# Patient Record
Sex: Male | Born: 1935 | Hispanic: No | Smoking: Former smoker
Health system: Southern US, Community
[De-identification: ages and names within clinical notes are randomized; demographics above are authoritative.]

## PROBLEM LIST (undated history)

## (undated) DIAGNOSIS — H6123 Impacted cerumen, bilateral: Secondary | ICD-10-CM

## (undated) DIAGNOSIS — I10 Essential (primary) hypertension: Secondary | ICD-10-CM

## (undated) DIAGNOSIS — E7800 Pure hypercholesterolemia, unspecified: Secondary | ICD-10-CM

## (undated) DIAGNOSIS — N183 Chronic kidney disease, stage 3 (moderate): Secondary | ICD-10-CM

## (undated) DIAGNOSIS — Z131 Encounter for screening for diabetes mellitus: Secondary | ICD-10-CM

## (undated) DIAGNOSIS — I83891 Varicose veins of right lower extremities with other complications: Secondary | ICD-10-CM

## (undated) DIAGNOSIS — I6521 Occlusion and stenosis of right carotid artery: Secondary | ICD-10-CM

## (undated) DIAGNOSIS — E785 Hyperlipidemia, unspecified: Secondary | ICD-10-CM

## (undated) DIAGNOSIS — I639 Cerebral infarction, unspecified: Secondary | ICD-10-CM

## (undated) HISTORY — DX: Chronic kidney disease, stage 3 (moderate): N18.3

## (undated) HISTORY — DX: Pure hypercholesterolemia, unspecified: E78.00

## (undated) HISTORY — DX: Occlusion and stenosis of right carotid artery: I65.21

## (undated) HISTORY — DX: Essential (primary) hypertension: I10

## (undated) HISTORY — DX: Impacted cerumen, bilateral: H61.23

## (undated) HISTORY — DX: Varicose veins of right lower extremity with other complications: I83.891

## (undated) HISTORY — DX: Cerebral infarction, unspecified: I63.9

## (undated) HISTORY — DX: Encounter for screening for diabetes mellitus: Z13.1

## (undated) HISTORY — PX: HERNIA REPAIR: SHX51

## (undated) HISTORY — DX: Hyperlipidemia, unspecified: E78.5

---

## 2016-03-06 DIAGNOSIS — H353132 Nonexudative age-related macular degeneration, bilateral, intermediate dry stage: Secondary | ICD-10-CM | POA: Diagnosis not present

## 2017-03-01 DIAGNOSIS — H43813 Vitreous degeneration, bilateral: Secondary | ICD-10-CM | POA: Diagnosis not present

## 2017-03-01 DIAGNOSIS — H353132 Nonexudative age-related macular degeneration, bilateral, intermediate dry stage: Secondary | ICD-10-CM | POA: Diagnosis not present

## 2017-09-26 DIAGNOSIS — H2513 Age-related nuclear cataract, bilateral: Secondary | ICD-10-CM | POA: Diagnosis not present

## 2017-10-25 DIAGNOSIS — R0981 Nasal congestion: Secondary | ICD-10-CM | POA: Diagnosis not present

## 2017-10-25 DIAGNOSIS — M7989 Other specified soft tissue disorders: Secondary | ICD-10-CM | POA: Diagnosis not present

## 2017-10-25 DIAGNOSIS — J181 Lobar pneumonia, unspecified organism: Secondary | ICD-10-CM | POA: Diagnosis not present

## 2017-10-25 DIAGNOSIS — I10 Essential (primary) hypertension: Secondary | ICD-10-CM | POA: Diagnosis not present

## 2017-10-25 DIAGNOSIS — E785 Hyperlipidemia, unspecified: Secondary | ICD-10-CM | POA: Diagnosis not present

## 2017-10-25 DIAGNOSIS — Z79899 Other long term (current) drug therapy: Secondary | ICD-10-CM | POA: Diagnosis not present

## 2017-10-25 DIAGNOSIS — J3489 Other specified disorders of nose and nasal sinuses: Secondary | ICD-10-CM | POA: Diagnosis not present

## 2017-11-02 DIAGNOSIS — L819 Disorder of pigmentation, unspecified: Secondary | ICD-10-CM | POA: Diagnosis not present

## 2017-11-02 DIAGNOSIS — E785 Hyperlipidemia, unspecified: Secondary | ICD-10-CM | POA: Diagnosis not present

## 2017-11-02 DIAGNOSIS — Z79899 Other long term (current) drug therapy: Secondary | ICD-10-CM | POA: Diagnosis not present

## 2017-11-02 DIAGNOSIS — R079 Chest pain, unspecified: Secondary | ICD-10-CM | POA: Diagnosis not present

## 2017-11-02 DIAGNOSIS — I10 Essential (primary) hypertension: Secondary | ICD-10-CM | POA: Diagnosis not present

## 2017-11-02 DIAGNOSIS — R9431 Abnormal electrocardiogram [ECG] [EKG]: Secondary | ICD-10-CM | POA: Diagnosis not present

## 2017-11-02 DIAGNOSIS — R239 Unspecified skin changes: Secondary | ICD-10-CM | POA: Diagnosis not present

## 2017-11-05 ENCOUNTER — Encounter: Payer: Self-pay | Admitting: Physician Assistant

## 2017-11-05 ENCOUNTER — Ambulatory Visit (INDEPENDENT_AMBULATORY_CARE_PROVIDER_SITE_OTHER): Payer: Medicare Other | Admitting: Physician Assistant

## 2017-11-05 ENCOUNTER — Encounter (INDEPENDENT_AMBULATORY_CARE_PROVIDER_SITE_OTHER): Payer: Self-pay

## 2017-11-05 VITALS — BP 140/82 | HR 82 | Ht 66.0 in | Wt 140.0 lb

## 2017-11-05 DIAGNOSIS — I872 Venous insufficiency (chronic) (peripheral): Secondary | ICD-10-CM | POA: Diagnosis not present

## 2017-11-05 DIAGNOSIS — R609 Edema, unspecified: Secondary | ICD-10-CM | POA: Diagnosis not present

## 2017-11-05 DIAGNOSIS — E7800 Pure hypercholesterolemia, unspecified: Secondary | ICD-10-CM

## 2017-11-05 DIAGNOSIS — I83891 Varicose veins of right lower extremities with other complications: Secondary | ICD-10-CM | POA: Insufficient documentation

## 2017-11-05 DIAGNOSIS — I6521 Occlusion and stenosis of right carotid artery: Secondary | ICD-10-CM

## 2017-11-05 DIAGNOSIS — R079 Chest pain, unspecified: Secondary | ICD-10-CM | POA: Diagnosis not present

## 2017-11-05 DIAGNOSIS — N183 Chronic kidney disease, stage 3 unspecified: Secondary | ICD-10-CM

## 2017-11-05 DIAGNOSIS — K2100 Gastro-esophageal reflux disease with esophagitis, without bleeding: Secondary | ICD-10-CM

## 2017-11-05 DIAGNOSIS — I10 Essential (primary) hypertension: Secondary | ICD-10-CM

## 2017-11-05 DIAGNOSIS — Z131 Encounter for screening for diabetes mellitus: Secondary | ICD-10-CM

## 2017-11-05 DIAGNOSIS — H6123 Impacted cerumen, bilateral: Secondary | ICD-10-CM

## 2017-11-05 DIAGNOSIS — R600 Localized edema: Secondary | ICD-10-CM

## 2017-11-05 HISTORY — DX: Pure hypercholesterolemia, unspecified: E78.00

## 2017-11-05 HISTORY — DX: Encounter for screening for diabetes mellitus: Z13.1

## 2017-11-05 HISTORY — DX: Essential (primary) hypertension: I10

## 2017-11-05 HISTORY — DX: Varicose veins of right lower extremity with other complications: I83.891

## 2017-11-05 HISTORY — DX: Chronic kidney disease, stage 3 unspecified: N18.30

## 2017-11-05 HISTORY — DX: Occlusion and stenosis of right carotid artery: I65.21

## 2017-11-05 HISTORY — DX: Impacted cerumen, bilateral: H61.23

## 2017-11-05 MED ORDER — RANITIDINE HCL 150 MG PO TABS: 150.0000 mg | ORAL_TABLET | Freq: Two times a day (BID) | ORAL | 5 refills | Status: DC

## 2017-11-05 NOTE — Progress Notes (Addendum)
Subjective:    Patient ID: Mark Mora, male    DOB: 1919/12/23, 83 y.o.   MRN: [id number]  HPI Mark Mora is a 83 y/o male who recently relocated to [place] from [place]. He is here to establish care.   He was seen in the ER on 10/25/17 and treated for pneumonia with levofloxacin. He returned to the ER on 11/02/17 for left 4th finger pain/discoloration and chest pain/discomfort and received a cardiac workup. He states that he does not have any chest pain today but described it as intermittent "discomfort" across his lower chest. He tried drinking baking soda in water and experienced relief of his symptoms so he believes it is acid reflux. He was referred to Dr. Alroy Dust with [facility] for further cardiac workup with stress test and cardiac ultrasound, however has not scheduled an appointment yet because he would prefer to see a cardiologist in the [place]. His left 4th finger is still slightly discolored and purple today but does not hurt. Cardiac work up with EKG, labs, CXR were normal. They referred him to a cardiologist for further work up.  Patient denies chest pain, and shortness of breath. He reports swelling in his right lower extremity and foot that is typically relieved with elevation. He has not been able to get up and walk around much due to back pain from spinal stenosis. He also complains of decreased hearing over the last couple months due to wax build up in his ears and requests to have them cleaned today.  Health maintenance - he received the flu vaccine in October, pneumonia vaccine and tetanus vaccine unknown so we will check his records that are being sent over, he has not had the shingles vaccine.  .. Active Ambulatory Problems    Diagnosis Date Noted  . CKD (chronic kidney disease) stage 3, GFR 30-59 ml/min (HCC) 11/05/2017  . 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 Essential hypertension 11/05/2017  . Carotid atherosclerosis, right  11/05/2017  . Pure hypercholesterolemia 11/05/2017  . Screening for diabetes mellitus 11/05/2017  . Hearing loss due to cerumen impaction, bilateral 11/05/2017   Resolved Ambulatory Problems    Diagnosis Date Noted  . No Resolved Ambulatory Problems   Past Medical History:  Diagnosis Date  . Hyperlipidemia   . Hypertension      Review of Systems  HENT: Positive for hearing loss (patient states he has wax buildup).   Respiratory: Negative for shortness of breath.   Cardiovascular: Positive for leg swelling (right). Negative for chest pain.  Musculoskeletal: Positive for back pain.      Objective:   Physical Exam  Constitutional: He is oriented to person, place, and time. He appears well-developed and well-nourished.  HENT:  Head: Normocephalic and atraumatic.  Unable to visualize TM due to cerumen buildup  Neck: Normal range of motion. Neck supple.  Cardiovascular: Normal rate and regular rhythm.  Pulmonary/Chest: Effort normal and breath sounds normal. He has no wheezes.  Musculoskeletal: He exhibits edema (Right lower extremity).  Neurological: He is alert and oriented to person, place, and time.  Psychiatric: He has a normal mood and affect. His behavior is normal. Thought content normal.      Assessment & Plan:  Mark Mora was seen today for new patient (initial visit).  Diagnoses and all orders for this visit:  Pure hypercholesterolemia -     Lipid Panel w/reflex Direct LDL  Screening for diabetes mellitus -     COMPLETE METABOLIC PANEL WITH  GFR  Carotid atherosclerosis, right -     Lipid Panel w/reflex Direct LDL  Essential hypertension  Edema of both lower legs due to peripheral venous insufficiency  CKD (chronic kidney disease) stage 3, GFR 30-59 ml/min (HCC)  Hearing loss due to cerumen impaction, bilateral  Gastroesophageal reflux disease with esophagitis -     ranitidine (ZANTAC) 150 MG tablet; Take 1 tablet (150 mg total) by mouth 2 (two) times  daily.  Chest pain, unspecified type -     Ambulatory referral to Cardiology  .Marland Kitchen Depression screen [facility] 2/9 11/05/2017 11/05/2017  Decreased Interest 0 0  Down, Depressed, Hopeless 0 0  PHQ - 2 Score 0 0  Altered sleeping 0 -  Tired, decreased energy 1 -  Change in appetite 0 -  Feeling bad or failure about yourself  0 -  Trouble concentrating 0 -  Moving slowly or fidgety/restless 0 -  Suicidal thoughts 0 -  PHQ-9 Score 1 -  Difficult doing work/chores Not difficult at all -   Chest pain -unclear etiology suspect due to GERD. See GERD treatment plan -will make new referral to [facility] provider at patients request. For full work up.   Pure hypercholesterolemia and Carotid atherosclerosis, right - Patient stated that it has been a long time since he had his cholesterol checked, however was started on a statin after having right carotid artery surgery in 2006 for partial blockage. Will get Lipid Panel w/reflex Direct LDL. Advised patient to followup with cardiologist for full cardiac workup and we will send a referral to [facility] for cardiologist. Patient was advised that he should be called with referral and to notify us if he has not heard from a cardiologist within one week. His last Carotid US was about 1 year ago so he is due for repeat carotid US and we will request this be done at the same time as his cardiac workup and stress test. -pt is on statin.  Essential hypertension - Continue taking antihypertensives as prescribed. Patient was advised that when he starts to run low on his medications that we can send a refill prescription.  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 Noted obstructive, copious cerumen that cannot be removed without magnification and instrumentations requiring physician skills Consent: Discussed benefits and risks of procedure and verbal consent obtained Procedure: Patient was prepped for the procedure. Utilized an otoscope to assess and take note of the ear canal, the tympanic membrane, and the presence, amount, and placement of the cerumen. Gentle water irrigation and soft plastic curette was utilized to remove cerumen.  Post procedure examination: shows cerumen was completely removed. Patient tolerated procedure well. The patient is made aware that they may experience temporary vertigo, temporary hearing loss, and temporary discomfort. If these symptom last for more than 24 hours to call the clinic or proceed to the ED.    GERD - Prescribed Zantac 150mg  to be taken twice daily as needed. Advised patient to take  one pill about 30 minutes before his largest meal for best results, however he can take up to 2 pills daily if needed.  - Advised patient to follow-up every 6 months, or sooner if needed.

## 2017-11-05 NOTE — Progress Notes (Deleted)
   Subjective:    Patient ID: Mark Mora, male    DOB: [birth year], 83 y.o.   MRN: [id number]  HPI Dr. Alroy Dust not gone yet. Lives in [place]. Will make different.   Flu shot October   Review of Systems  Constitutional: Positive for unexpected weight change.  Respiratory:       Chest pain   Genitourinary: Positive for frequency.       Objective:   Physical Exam        Assessment & Plan:

## 2017-11-06 LAB — COMPLETE METABOLIC PANEL WITH GFR
AG Ratio: 1.5 (calc) (ref 1.0–2.5)
ALBUMIN MSPROF: 3.8 g/dL (ref 3.6–5.1)
ALKALINE PHOSPHATASE (APISO): 42 U/L (ref 40–115)
ALT: 10 U/L (ref 9–46)
AST: 19 U/L (ref 10–35)
BILIRUBIN TOTAL: 0.4 mg/dL (ref 0.2–1.2)
BUN/Creatinine Ratio: 19 (calc) (ref 6–22)
BUN: 27 mg/dL — AB (ref 7–25)
CHLORIDE: 107 mmol/L (ref 98–110)
CO2: 21 mmol/L (ref 20–32)
Calcium: 9.1 mg/dL (ref 8.6–10.3)
Creat: 1.42 mg/dL — ABNORMAL HIGH (ref 0.70–1.11)
GFR, Est African American: 48 mL/min/{1.73_m2} — ABNORMAL LOW (ref >=60)
GFR, Est Non African American: 42 mL/min/{1.73_m2} — ABNORMAL LOW (ref >=60)
GLUCOSE: 103 mg/dL — AB (ref 65–99)
Globulin: 2.5 g/dL (calc) (ref 1.9–3.7)
POTASSIUM: 5 mmol/L (ref 3.5–5.3)
Sodium: 139 mmol/L (ref 135–146)
Total Protein: 6.3 g/dL (ref 6.1–8.1)

## 2017-11-06 LAB — LIPID PANEL W/REFLEX DIRECT LDL
CHOL/HDL RATIO: 1.5 (calc) (ref <5.0)
Cholesterol: 173 mg/dL (ref <200)
HDL: 119 mg/dL (ref >40)
LDL CHOLESTEROL (CALC): 41 mg/dL
Non-HDL Cholesterol (Calc): 54 mg/dL (calc) (ref <130)
TRIGLYCERIDES: 45 mg/dL (ref <150)

## 2017-11-14 ENCOUNTER — Other Ambulatory Visit: Payer: Self-pay | Admitting: *Deleted

## 2017-11-14 DIAGNOSIS — I10 Essential (primary) hypertension: Secondary | ICD-10-CM

## 2017-11-14 DIAGNOSIS — K2100 Gastro-esophageal reflux disease with esophagitis, without bleeding: Secondary | ICD-10-CM

## 2017-11-14 DIAGNOSIS — E7800 Pure hypercholesterolemia, unspecified: Secondary | ICD-10-CM

## 2017-11-14 DIAGNOSIS — I6521 Occlusion and stenosis of right carotid artery: Secondary | ICD-10-CM

## 2017-11-14 MED ORDER — LISINOPRIL 20 MG PO TABS: 20.0000 mg | ORAL_TABLET | Freq: Every day | ORAL | 1 refills | Status: DC

## 2017-11-14 MED ORDER — ATORVASTATIN CALCIUM 40 MG PO TABS: 40.0000 mg | ORAL_TABLET | Freq: Every day | ORAL | 1 refills | Status: DC

## 2017-11-14 MED ORDER — AMLODIPINE BESYLATE 5 MG PO TABS: 5.0000 mg | ORAL_TABLET | Freq: Every day | ORAL | 1 refills | Status: DC

## 2017-11-14 MED ORDER — RANITIDINE HCL 150 MG PO TABS: 150.0000 mg | ORAL_TABLET | Freq: Two times a day (BID) | ORAL | 3 refills | Status: DC

## 2017-11-15 ENCOUNTER — Telehealth: Payer: Self-pay | Admitting: Physician Assistant

## 2017-11-15 ENCOUNTER — Other Ambulatory Visit: Payer: Self-pay | Admitting: *Deleted

## 2017-11-15 NOTE — Telephone Encounter (Signed)
Patient's daughter dropped off a CD of medical records for patient. Daughter asked for a receipt. We typically do not give receipts for medical records, so I informed her we would enter a note in patient's chart documenting the receipt of his medical records.

## 2017-12-31 ENCOUNTER — Other Ambulatory Visit: Payer: Self-pay

## 2017-12-31 DIAGNOSIS — I10 Essential (primary) hypertension: Secondary | ICD-10-CM

## 2017-12-31 MED ORDER — LISINOPRIL 20 MG PO TABS: 20.0000 mg | ORAL_TABLET | Freq: Every day | ORAL | 1 refills | Status: DC

## 2017-12-31 MED ORDER — AMLODIPINE BESYLATE 5 MG PO TABS: 5.0000 mg | ORAL_TABLET | Freq: Every day | ORAL | 0 refills | Status: DC

## 2017-12-31 MED ORDER — AMLODIPINE BESYLATE 5 MG PO TABS: 5.0000 mg | ORAL_TABLET | Freq: Every day | ORAL | 1 refills | Status: DC

## 2017-12-31 MED ORDER — LISINOPRIL 20 MG PO TABS: 20.0000 mg | ORAL_TABLET | Freq: Every day | ORAL | 0 refills | Status: DC

## 2017-12-31 NOTE — Telephone Encounter (Signed)
Patient called in requesting medication refill on the following be sent to [facility]: Lisinopril and Amlodipine.  Advised patient a med refill for both medication will be electronically sent to [facility] to make sure he doesn't run out while new Rx is sent to [facility].  Patient stated he understood and had no other questions/concerns.

## 2018-01-16 ENCOUNTER — Other Ambulatory Visit: Payer: Self-pay | Admitting: *Deleted

## 2018-01-20 NOTE — Progress Notes (Deleted)
    Referring-Jade L Breeback PA-C Reason for referral-chest pain  HPI: 84 year old male for evaluation of chest pain at request of Donella Stade PA-C. Seen in ER 11/18 for CP; chest xray neg; troponin normal.   Current Outpatient Medications  Medication Sig Dispense Refill  . amLODipine (NORVASC) 5 MG tablet Take 1 tablet (5 mg total) by mouth daily. 90 tablet 1  . atorvastatin (LIPITOR) 40 MG tablet Take 1 tablet (40 mg total) by mouth daily. 90 tablet 1  . furosemide (LASIX) 20 MG tablet Take 20 mg by mouth.    Marland Kitchen lisinopril (PRINIVIL,ZESTRIL) 20 MG tablet Take 1 tablet (20 mg total) by mouth daily. 90 tablet 1  . ranitidine (ZANTAC) 150 MG tablet Take 1 tablet (150 mg total) by mouth 2 (two) times daily. 180 tablet 3   No current facility-administered medications for this visit.     Not on File  Past Medical History:  Diagnosis Date  . Hyperlipidemia   . Hypertension     Past Surgical History:  Procedure Laterality Date  . HERNIA REPAIR      Social History   Socioeconomic History  . Marital status: Widowed    Spouse name: Not on file  . Number of children: Not on file  . Years of education: Not on file  . Highest education level: Not on file  Social Needs  . Financial resource strain: Not on file  . Food insecurity - worry: Not on file  . Food insecurity - inability: Not on file  . Transportation needs - medical: Not on file  . Transportation needs - non-medical: Not on file  Occupational History  . Not on file  Tobacco Use  . Smoking status: Never Smoker  . Smokeless tobacco: Never Used  Substance and Sexual Activity  . Alcohol use: No    Frequency: Never  . Drug use: No  . Sexual activity: No  Other Topics Concern  . Not on file  Social History Narrative  . Not on file    No family history on file.  ROS: no fevers or chills, productive cough, hemoptysis, dysphasia, odynophagia, melena, hematochezia, dysuria, hematuria, rash, seizure activity,  orthopnea, PND, pedal edema, claudication. Remaining systems are negative.  Physical Exam:   There were no vitals taken for this visit.  General:  Well developed/well nourished in NAD Skin warm/dry Patient not depressed No peripheral clubbing Back-normal HEENT-normal/normal eyelids Neck supple/normal carotid upstroke bilaterally; no bruits; no JVD; no thyromegaly chest - CTA/ normal expansion CV - RRR/normal S1 and S2; no murmurs, rubs or gallops;  PMI nondisplaced Abdomen -NT/ND, no HSM, no mass, + bowel sounds, no bruit 2+ femoral pulses, no bruits Ext-no edema, chords, 2+ DP Neuro-grossly nonfocal  ECG - personally reviewed  A/P  1  Kirk Ruths, MD

## 2018-01-24 ENCOUNTER — Other Ambulatory Visit: Payer: Self-pay | Admitting: *Deleted

## 2018-01-24 DIAGNOSIS — I6521 Occlusion and stenosis of right carotid artery: Secondary | ICD-10-CM

## 2018-01-24 DIAGNOSIS — E7800 Pure hypercholesterolemia, unspecified: Secondary | ICD-10-CM

## 2018-01-24 MED ORDER — ATORVASTATIN CALCIUM 40 MG PO TABS: 40.0000 mg | ORAL_TABLET | Freq: Every day | ORAL | 1 refills | Status: DC

## 2018-01-29 ENCOUNTER — Inpatient Hospital Stay: Payer: Medicare (Managed Care) | Admitting: Physician Assistant

## 2018-01-29 ENCOUNTER — Ambulatory Visit: Payer: Medicare (Managed Care) | Admitting: Cardiology

## 2018-01-31 ENCOUNTER — Inpatient Hospital Stay: Payer: Medicare (Managed Care) | Admitting: Physician Assistant

## 2018-03-20 ENCOUNTER — Ambulatory Visit (INDEPENDENT_AMBULATORY_CARE_PROVIDER_SITE_OTHER): Payer: Medicare Other | Admitting: Physician Assistant

## 2018-03-20 ENCOUNTER — Encounter: Payer: Self-pay | Admitting: Physician Assistant

## 2018-03-20 VITALS — BP 148/81 | HR 83 | Ht 66.0 in | Wt 135.0 lb

## 2018-03-20 DIAGNOSIS — N183 Chronic kidney disease, stage 3 unspecified: Secondary | ICD-10-CM

## 2018-03-20 DIAGNOSIS — B351 Tinea unguium: Secondary | ICD-10-CM | POA: Insufficient documentation

## 2018-03-20 DIAGNOSIS — N401 Enlarged prostate with lower urinary tract symptoms: Secondary | ICD-10-CM | POA: Diagnosis not present

## 2018-03-20 DIAGNOSIS — R972 Elevated prostate specific antigen [PSA]: Secondary | ICD-10-CM

## 2018-03-20 DIAGNOSIS — G629 Polyneuropathy, unspecified: Secondary | ICD-10-CM | POA: Diagnosis not present

## 2018-03-20 DIAGNOSIS — R350 Frequency of micturition: Secondary | ICD-10-CM

## 2018-03-20 DIAGNOSIS — K2100 Gastro-esophageal reflux disease with esophagitis, without bleeding: Secondary | ICD-10-CM

## 2018-03-20 DIAGNOSIS — L600 Ingrowing nail: Secondary | ICD-10-CM | POA: Diagnosis not present

## 2018-03-20 DIAGNOSIS — H6122 Impacted cerumen, left ear: Secondary | ICD-10-CM | POA: Diagnosis not present

## 2018-03-20 LAB — CBC
HCT: 38.9 % (ref 38.5–50.0)
Hemoglobin: 13 g/dL — ABNORMAL LOW (ref 13.2–17.1)
MCH: 30.9 pg (ref 27.0–33.0)
MCHC: 33.4 g/dL (ref 32.0–36.0)
MCV: 92.4 fL (ref 80.0–100.0)
MPV: 10.7 fL (ref 7.5–12.5)
PLATELETS: 240 10*3/uL (ref 140–400)
RBC: 4.21 10*6/uL (ref 4.20–5.80)
RDW: 11.9 % (ref 11.0–15.0)
WBC: 7.7 10*3/uL (ref 3.8–10.8)

## 2018-03-20 LAB — COMPLETE METABOLIC PANEL WITH GFR
AG Ratio: 1.4 (calc) (ref 1.0–2.5)
ALBUMIN MSPROF: 4 g/dL (ref 3.6–5.1)
ALKALINE PHOSPHATASE (APISO): 53 U/L (ref 40–115)
ALT: 10 U/L (ref 9–46)
AST: 16 U/L (ref 10–35)
BILIRUBIN TOTAL: 0.5 mg/dL (ref 0.2–1.2)
BUN/Creatinine Ratio: 21 (calc) (ref 6–22)
BUN: 31 mg/dL — AB (ref 7–25)
CHLORIDE: 103 mmol/L (ref 98–110)
CO2: 27 mmol/L (ref 20–32)
CREATININE: 1.5 mg/dL — AB (ref 0.70–1.11)
Calcium: 9.5 mg/dL (ref 8.6–10.3)
GFR, Est African American: 45 mL/min/{1.73_m2} — ABNORMAL LOW (ref >=60)
GFR, Est Non African American: 39 mL/min/{1.73_m2} — ABNORMAL LOW (ref >=60)
GLOBULIN: 2.8 g/dL (ref 1.9–3.7)
GLUCOSE: 86 mg/dL (ref 65–99)
Potassium: 5.2 mmol/L (ref 3.5–5.3)
SODIUM: 137 mmol/L (ref 135–146)
Total Protein: 6.8 g/dL (ref 6.1–8.1)

## 2018-03-20 LAB — PSA: PSA: 36.7 ng/mL — AB (ref <=4.0)

## 2018-03-20 MED ORDER — GABAPENTIN 100 MG PO CAPS: ORAL_CAPSULE | ORAL | 3 refills | Status: DC

## 2018-03-20 MED ORDER — TAMSULOSIN HCL 0.4 MG PO CAPS: 0.4000 mg | ORAL_CAPSULE | Freq: Every day | ORAL | 3 refills | Status: DC

## 2018-03-20 MED ORDER — RANITIDINE HCL 150 MG PO TABS: 150.0000 mg | ORAL_TABLET | Freq: Two times a day (BID) | ORAL | 3 refills | Status: DC

## 2018-03-20 NOTE — Progress Notes (Signed)
Subjective:    Patient ID: Mark Mora, male    DOB: [birth year], 84 y.o.   MRN: [id number]  HPI Mark Mora is a 84 yo male who  presents today regarding a pain that is burning in nature to the bottom of his right foot. He states this happens primarily at night time while he is sleeping (it awakens him from his sleep). He does have a history of spinal stenosis. He also complains about worsening ingrown toe nails. He has tried topical anti-fungals which have provided minimal relief. He also mentions that he is having some abnormal sensation in his left year and is wondering if we can clean them out; denies any hearing changes. He feels like flomax is not working. He is urinating a lot at night.   Daughter is present during encounter and she states that she is still active and walking some, although short distances but he is relatively independent but she stays with him many night.   .. Active Ambulatory Problems    Diagnosis Date Noted  . CKD (chronic kidney disease) stage 3, GFR 30-59 ml/min (HCC) 11/05/2017  . 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 Essential hypertension 11/05/2017  . Carotid atherosclerosis, right 11/05/2017  . Pure hypercholesterolemia 11/05/2017  . Screening for diabetes mellitus 11/05/2017  . Hearing loss due to cerumen impaction, bilateral 11/05/2017  . Ingrown toenail of left foot 03/20/2018  . Toenail fungus 03/20/2018  . Hearing loss of left ear due to cerumen impaction 03/20/2018   Resolved Ambulatory Problems    Diagnosis Date Noted  . No Resolved Ambulatory Problems   Past Medical History:  Diagnosis Date  . Carotid atherosclerosis, right 11/05/2017  . CKD (chronic kidney disease) stage 3, GFR 30-59 ml/min (HCC) 11/05/2017  . Essential hypertension 11/05/2017  . Hearing loss due to cerumen impaction, bilateral 11/05/2017  . Hyperlipidemia   . Hypertension   . Pure hypercholesterolemia 11/05/2017  . Screening for diabetes  mellitus 11/05/2017  . Varicose veins of right lower extremity with edema 11/05/2017      Review of Systems  Constitutional: Negative for activity change, appetite change, fever and unexpected weight change.  HENT: Negative for ear discharge, ear pain, hearing loss ( decreased hearing L side) and sinus pressure.   Eyes: Negative for visual disturbance.  Respiratory: Negative for cough and shortness of breath.   Cardiovascular: Negative for chest pain.  Genitourinary: Positive for frequency. Negative for dysuria and flank pain.  Skin: Positive for color change.  Neurological:       Burning pain to the bottom of right foot       Objective:   Physical Exam  Constitutional: He is oriented to person, place, and time. He appears well-developed and well-nourished. No distress.  HENT:  Head: Normocephalic and atraumatic.  Right Ear: External ear normal.  Left Ear: External ear normal.  Significant amount of wax in the left ear canal, minimal amount in the right ear canal  Eyes: Conjunctivae are normal.  Neck: Normal range of motion. Neck supple.  Cardiovascular: Normal rate, regular rhythm and normal heart sounds.  Decreased distal dorsalis pedis and posterior tibialis pulses bilaterally; cap refill ~3 seconds bilaterally  Pulmonary/Chest: Effort normal and breath sounds normal. No respiratory distress. He has no wheezes.  Musculoskeletal: Normal range of motion.  Neurological: He is alert and oriented to person, place, and time.  Skin:  Erythematous/purple discoloration to the tops of feet bilaterally; no skin break down, ulcerations, or other lesions  present  Psychiatric: He has a normal mood and affect. His behavior is normal.          Assessment & Plan:  Marland KitchenMarland KitchenDiagnoses and all orders for this visit:  Neuropathy -     gabapentin (NEURONTIN) 100 MG capsule; Take 1-3 tablets at bedtime for burning and tingling of feet.  Gastroesophageal reflux disease with esophagitis -      ranitidine (ZANTAC) 150 MG tablet; Take 1 tablet (150 mg total) by mouth 2 (two) times daily. -     CBC  Benign prostatic hyperplasia with urinary frequency -     tamsulosin (FLOMAX) 0.4 MG CAPS capsule; Take 1 capsule (0.4 mg total) by mouth daily. -     PSA  CKD (chronic kidney disease) stage 3, GFR 30-59 ml/min (HCC) -     COMPLETE METABOLIC PANEL WITH GFR  Hearing loss of left ear due to cerumen impaction  Toenail fungus -     Ambulatory referral to Podiatry  Ingrown toenail of left foot -     Ambulatory referral to Podiatry   .Marland KitchenIndication: Cerumen impaction of the ear(s)  Medical necessity statement: On physical examination, cerumen impairs clinically significant portions of the external auditory canal, and tympanic membrane. Noted obstructive, copious cerumen that cannot be removed without magnification and instrumentations requiring physician skills Consent: Discussed benefits and risks of procedure and verbal consent obtained Procedure: Patient was prepped for the procedure. Utilized an otoscope to assess and take note of the ear canal, the tympanic membrane, and the presence, amount, and placement of the cerumen. Gentle water irrigation and soft plastic curette was utilized to remove cerumen.  Post procedure examination: shows cerumen was completely removed. Patient tolerated procedure well. The patient is made aware that they may experience temporary vertigo, temporary hearing loss, and temporary discomfort. If these symptom last for more than 24 hours to call the clinic or proceed to the ED.  Pt tried antifungals he may need toenail removed. Referral to podiatry.   Refilled flomax. Will check PSA. Pt did not want to add another medication to help with urinary symptoms.   Sounds like patient is having some neuropathy. Started gabapentin at bedtime to see if it helps. Follow up as needed.

## 2018-03-20 NOTE — Progress Notes (Deleted)
Referral podiatrist.  Left worst than right.

## 2018-03-21 NOTE — Progress Notes (Signed)
Call pt: kidney function has worsened a little bit. Still in stage III CKD. Make sure staying hydrated. NO anti-inflammatories. Tylenol only.  Prostate antigen is really high and likely why he is having so many problems with urination. I am going to send to urology. Is this ok?

## 2018-03-23 DIAGNOSIS — N401 Enlarged prostate with lower urinary tract symptoms: Secondary | ICD-10-CM | POA: Insufficient documentation

## 2018-03-23 DIAGNOSIS — R350 Frequency of micturition: Secondary | ICD-10-CM

## 2018-03-23 DIAGNOSIS — K2100 Gastro-esophageal reflux disease with esophagitis, without bleeding: Secondary | ICD-10-CM | POA: Insufficient documentation

## 2018-03-23 DIAGNOSIS — G629 Polyneuropathy, unspecified: Secondary | ICD-10-CM | POA: Insufficient documentation

## 2018-03-25 ENCOUNTER — Encounter: Payer: Self-pay | Admitting: Physician Assistant

## 2018-03-25 ENCOUNTER — Telehealth: Payer: Self-pay

## 2018-03-25 NOTE — Addendum Note (Signed)
Addended by: Donella Stade on: 03/25/2018 10:52 AM   Modules accepted: Orders

## 2018-03-25 NOTE — Progress Notes (Signed)
Done

## 2018-04-01 ENCOUNTER — Telehealth: Payer: Self-pay | Admitting: Physician Assistant

## 2018-04-01 NOTE — Telephone Encounter (Signed)
Patient's daughter came into the office to inquire about the patient's referral to urology. She stated that her father is more concerned about his Stage 3 kidney disease than his prostate and was wondering if the urologist could address his concerns with his kidneys or would he also need a referral to a nephrologist. Please advise. Thanks!

## 2018-04-08 NOTE — Telephone Encounter (Signed)
Pt's daughter Caren Griffins) advised. He has been scheduled at [facility].

## 2018-04-08 NOTE — Telephone Encounter (Signed)
It actually is very likely that they are linked. When prostate enlarges it can cause partial obstruction to urine flow and then back up in the kidney. With a PSA that high there is likely some obstruction. Stage III kidney disease is monitored with BP control and making sure he is on no medications that would hurt his kidneys. Lets start with urologist to see if they do something to decrease prostate size. We can recheck kidney function in next month and if declining refer to nephrology. Are you ok with this plan?

## 2018-04-22 ENCOUNTER — Telehealth: Payer: Self-pay | Admitting: Emergency Medicine

## 2018-04-25 NOTE — Telephone Encounter (Signed)
No additional encounter note found

## 2018-04-29 ENCOUNTER — Ambulatory Visit (INDEPENDENT_AMBULATORY_CARE_PROVIDER_SITE_OTHER): Payer: Medicare Other | Admitting: Physician Assistant

## 2018-04-29 ENCOUNTER — Encounter: Payer: Self-pay | Admitting: Physician Assistant

## 2018-04-29 VITALS — BP 118/53 | HR 101 | Ht 66.0 in | Wt 132.0 lb

## 2018-04-29 DIAGNOSIS — R200 Anesthesia of skin: Secondary | ICD-10-CM

## 2018-04-29 DIAGNOSIS — R202 Paresthesia of skin: Secondary | ICD-10-CM

## 2018-04-29 DIAGNOSIS — N401 Enlarged prostate with lower urinary tract symptoms: Secondary | ICD-10-CM

## 2018-04-29 DIAGNOSIS — R350 Frequency of micturition: Secondary | ICD-10-CM

## 2018-04-29 DIAGNOSIS — Z8546 Personal history of malignant neoplasm of prostate: Secondary | ICD-10-CM | POA: Diagnosis not present

## 2018-04-29 NOTE — Progress Notes (Signed)
Prostate cancer 1992 treated with radiation.  constat

## 2018-05-05 ENCOUNTER — Encounter: Payer: Self-pay | Admitting: Physician Assistant

## 2018-05-05 DIAGNOSIS — R202 Paresthesia of skin: Principal | ICD-10-CM

## 2018-05-05 DIAGNOSIS — Z8546 Personal history of malignant neoplasm of prostate: Secondary | ICD-10-CM | POA: Insufficient documentation

## 2018-05-05 DIAGNOSIS — R200 Anesthesia of skin: Secondary | ICD-10-CM | POA: Insufficient documentation

## 2018-05-05 NOTE — Progress Notes (Signed)
   Subjective:    Patient ID: Mark Mora, male    DOB: 1920-08-17, 84 y.o.   MRN: [id number]  HPI Pt is a 84 yo male who is accompanied by his daughter who comes in to follow up on bilateral feet numbness and tingling. I had first thought this was neuropathy. Gabapentin was given without any relief. Upon further questioning it appears numbness/tingling is only happening from 3 oclock until walks around. He does sleep with this legs elevated.   He has appt with urology. PSA was 37 with hx of prostate cancer. SCr was up as well. Concerned with recent worsening of prostate symptoms about obstruction.   .. Active Ambulatory Problems    Diagnosis Date Noted  . CKD (chronic kidney disease) stage 3, GFR 30-59 ml/min (HCC) 11/05/2017  . 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 Essential hypertension 11/05/2017  . Carotid atherosclerosis, right 11/05/2017  . Pure hypercholesterolemia 11/05/2017  . Screening for diabetes mellitus 11/05/2017  . Hearing loss due to cerumen impaction, bilateral 11/05/2017  . Ingrown toenail of left foot 03/20/2018  . Toenail fungus 03/20/2018  . Hearing loss of left ear due to cerumen impaction 03/20/2018  . Gastroesophageal reflux disease with esophagitis 03/23/2018  . Benign prostatic hyperplasia with urinary frequency 03/23/2018  . Neuropathy 03/23/2018  . History of prostate cancer 05/05/2018  . Numbness and tingling of both feet 05/05/2018   Resolved Ambulatory Problems    Diagnosis Date Noted  . No Resolved Ambulatory Problems   Past Medical History:  Diagnosis Date  . Carotid atherosclerosis, right 11/05/2017  . CKD (chronic kidney disease) stage 3, GFR 30-59 ml/min (HCC) 11/05/2017  . Essential hypertension 11/05/2017  . Hearing loss due to cerumen impaction, bilateral 11/05/2017  . Hyperlipidemia   . Hypertension   . Pure hypercholesterolemia 11/05/2017  . Screening for diabetes mellitus 11/05/2017  . Varicose veins of  right lower extremity with edema 11/05/2017      Review of Systems  All other systems reviewed and are negative.      Objective:   Physical Exam  Constitutional: He is oriented to person, place, and time. He appears well-developed and well-nourished.  HENT:  Head: Normocephalic and atraumatic.  Cardiovascular: Normal rate and regular rhythm.  Pulmonary/Chest: Effort normal and breath sounds normal.  Neurological: He is alert and oriented to person, place, and time.  Psychiatric: He has a normal mood and affect. His behavior is normal.          Assessment & Plan:  Marland KitchenMarland KitchenDiagnoses and all orders for this visit:  Numbness and tingling of both feet  History of prostate cancer  Benign prostatic hyperplasia with urinary frequency  Stop gabapentin. I feel like his feet might just be going to sleep or circulation is off. Stop sleeping with feet elevated. Consider compression stockings or knee high socks to sleep in at night. See if this helps.   Discussed starting a 2nd bPH medications. Would like to hold off for urology appt.

## 2018-05-07 ENCOUNTER — Other Ambulatory Visit: Payer: Self-pay

## 2018-05-07 DIAGNOSIS — R609 Edema, unspecified: Principal | ICD-10-CM

## 2018-05-07 DIAGNOSIS — I872 Venous insufficiency (chronic) (peripheral): Secondary | ICD-10-CM

## 2018-05-07 DIAGNOSIS — R600 Localized edema: Secondary | ICD-10-CM

## 2018-05-07 MED ORDER — FUROSEMIDE 20 MG PO TABS: 20.0000 mg | ORAL_TABLET | Freq: Every day | ORAL | 1 refills | Status: DC

## 2018-05-07 NOTE — Telephone Encounter (Signed)
Mark Mora requests a refill of furosemide 20 mg. Historical provider.

## 2018-05-21 ENCOUNTER — Ambulatory Visit (INDEPENDENT_AMBULATORY_CARE_PROVIDER_SITE_OTHER): Payer: Medicare Other | Admitting: Physician Assistant

## 2018-05-21 ENCOUNTER — Encounter: Payer: Self-pay | Admitting: Physician Assistant

## 2018-05-21 VITALS — BP 116/64 | HR 102 | Temp 98.2°F | Ht 66.0 in | Wt 126.0 lb

## 2018-05-21 DIAGNOSIS — R202 Paresthesia of skin: Secondary | ICD-10-CM

## 2018-05-21 DIAGNOSIS — R634 Abnormal weight loss: Secondary | ICD-10-CM | POA: Diagnosis not present

## 2018-05-21 DIAGNOSIS — M48061 Spinal stenosis, lumbar region without neurogenic claudication: Secondary | ICD-10-CM

## 2018-05-21 DIAGNOSIS — C61 Malignant neoplasm of prostate: Secondary | ICD-10-CM | POA: Diagnosis not present

## 2018-05-21 DIAGNOSIS — R200 Anesthesia of skin: Secondary | ICD-10-CM

## 2018-05-21 MED ORDER — METHYLPREDNISOLONE 4 MG PO TBPK: ORAL_TABLET | ORAL | 0 refills | Status: DC

## 2018-05-21 MED ORDER — TRAMADOL HCL 50 MG PO TABS: 50.0000 mg | ORAL_TABLET | Freq: Four times a day (QID) | ORAL | 0 refills | Status: DC | PRN

## 2018-05-21 NOTE — Progress Notes (Signed)
Subjective:    Patient ID: Mark Mora, male    DOB: 19-Jan-1920, 84 y.o.   MRN: [id number]  HPI Patient is a 84 yo male with a pmhx of prostate cancer, spinal stenosis, HTN, and neuropathy presenting to clinic for congestion and lower leg pain and numbness.  Burning/Numbness - Patient attributes his symptoms to history of spinal stenosis. Symptoms began as burning in his right heel for which he was started on gabapentin. He states it did not help and he discontinued taking it. Burning has progressed to both legs and is now from plantar feet up to his knees. He also has some pain in the plantar region. Four days ago he began to feel numbness as well in the same distribution. The symptoms are the worst at night - waking him up from sleep around 3am to 4am. After he gets up and walks around symptoms improve. He would like an MRI to look into this problem.  Spinal stenosis - He has chronic 8/10 back pain that contributes to difficulty with ambulation. He uses a cane. Pain is improved with laying down.  Prostate Cancer - He is being followed by urology and will be getting a bone scan and pelvic CT tomorrow.   Weight loss - He is concerned he is losing weight. In November he weighed 140 lbs and he is 126 lbs today. He admits to some decreased appetite and decreased intake. He is sleeping later and therefore only eating two meals a day.  Congestion - He has had post nasal drip on and off for many years. He uses Corcidin which helps some. He has used flonase in the past but not for several months. He has a scratchy throat and occasional cough. About 4 days ago he choked while trying to swallow a pill and experienced some forceful coughing. He denies SHOB, fever. He has some lower bilateral chest pain over his lowest ribs. He describes the pain as tightness.   .. Active Ambulatory Problems    Diagnosis Date Noted  . CKD (chronic kidney disease) stage 3, GFR 30-59 ml/min (HCC) 11/05/2017  . 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 Essential hypertension 11/05/2017  . Carotid atherosclerosis, right 11/05/2017  . Pure hypercholesterolemia 11/05/2017  . Screening for diabetes mellitus 11/05/2017  . Hearing loss due to cerumen impaction, bilateral 11/05/2017  . Ingrown toenail of left foot 03/20/2018  . Toenail fungus 03/20/2018  . Hearing loss of left ear due to cerumen impaction 03/20/2018  . Gastroesophageal reflux disease with esophagitis 03/23/2018  . Benign prostatic hyperplasia with urinary frequency 03/23/2018  . Neuropathy 03/23/2018  . History of prostate cancer 05/05/2018  . Numbness and tingling of both feet 05/05/2018  . Prostate cancer ([place]) 05/23/2018  . Weight loss 05/23/2018  . Spinal stenosis of lumbar region 05/23/2018   Resolved Ambulatory Problems    Diagnosis Date Noted  . No Resolved Ambulatory Problems   Past Medical History:  Diagnosis Date  . Carotid atherosclerosis, right 11/05/2017  . CKD (chronic kidney disease) stage 3, GFR 30-59 ml/min (HCC) 11/05/2017  . Essential hypertension 11/05/2017  . Hearing loss due to cerumen impaction, bilateral 11/05/2017  . Hyperlipidemia   . Hypertension   . Pure hypercholesterolemia 11/05/2017  . Screening for diabetes mellitus 11/05/2017  . Varicose veins of right lower extremity with edema 11/05/2017      Review of Systems  All other systems reviewed and are negative.      Objective:  Physical Exam  Constitutional: He is oriented to person, place, and time. He appears well-developed and well-nourished. No distress.  HENT:  Head: Normocephalic and atraumatic.  Right Ear: External ear normal.  Left Ear: External ear normal.  Nose: Nose normal.  Mouth/Throat: Oropharynx is clear and moist.  Cardiovascular: Normal rate, regular rhythm and normal heart sounds.  Pulmonary/Chest: Effort normal and breath sounds normal. He exhibits tenderness (along bilateral lower rib line. Right  worse than left).  Neurological: He is alert and oriented to person, place, and time. He has normal strength.  Strength normal in bilateral lower legs. Numbness/burning over posterior and anterior lower leg from medial plantar surface to knee. Some discomfort with resisted hip flexion but no sensory symptoms.   Skin: Skin is warm and dry. He is not diaphoretic.  Psychiatric: He has a normal mood and affect. His behavior is normal.    .. Active Ambulatory Problems    Diagnosis Date Noted  . CKD (chronic kidney disease) stage 3, GFR 30-59 ml/min (HCC) 11/05/2017  . 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 Essential hypertension 11/05/2017  . Carotid atherosclerosis, right 11/05/2017  . Pure hypercholesterolemia 11/05/2017  . Screening for diabetes mellitus 11/05/2017  . Hearing loss due to cerumen impaction, bilateral 11/05/2017  . Ingrown toenail of left foot 03/20/2018  . Toenail fungus 03/20/2018  . Hearing loss of left ear due to cerumen impaction 03/20/2018  . Gastroesophageal reflux disease with esophagitis 03/23/2018  . Benign prostatic hyperplasia with urinary frequency 03/23/2018  . Neuropathy 03/23/2018  . History of prostate cancer 05/05/2018  . Numbness and tingling of both feet 05/05/2018  . Prostate cancer ([place]) 05/23/2018  . Weight loss 05/23/2018  . Spinal stenosis of lumbar region 05/23/2018   Resolved Ambulatory Problems    Diagnosis Date Noted  . No Resolved Ambulatory Problems   Past Medical History:  Diagnosis Date  . Carotid atherosclerosis, right 11/05/2017  . CKD (chronic kidney disease) stage 3, GFR 30-59 ml/min (HCC) 11/05/2017  . Essential hypertension 11/05/2017  . Hearing loss due to cerumen impaction, bilateral 11/05/2017  . Hyperlipidemia   . Hypertension   . Pure hypercholesterolemia 11/05/2017  . Screening for diabetes mellitus 11/05/2017  . Varicose veins of right lower extremity with edema 11/05/2017         Assessment & Plan:  Diagnoses and all orders for this visit:  Numbness and tingling of both legs below knees -     methylPREDNISolone (MEDROL DOSEPAK) 4 MG TBPK tablet; Sig: Day 1: 8 mg PO before breakfast, 4 mg after lunch and after dinner, and 8 mg at bedtime Day 2: 4 mg PO before breakfast, after lunch, and after dinner and 8 mg at bedtime Day 3: 4 mg PO before breakfast, after lunch, after dinner, and at bedtime Day 4: 4 mg PO before breakfast, after lunch, and at bedtime Day 5: 4 mg PO before breakfast and at bedtime Day 6: 4 mg PO before breakfast  Weight loss  Prostate cancer ([place])  Spinal stenosis of lumbar region, unspecified whether neurogenic claudication present -     traMADol (ULTRAM) 50 MG tablet; Take 1 tablet (50 mg total) by mouth every 6 (six) hours as needed. -     methylPREDNISolone (MEDROL DOSEPAK) 4 MG TBPK tablet; Sig: Day 1: 8 mg PO before breakfast, 4 mg after lunch and after dinner, and 8 mg at bedtime Day 2: 4 mg PO before breakfast, after lunch, and after dinner and 8  mg at bedtime Day 3: 4 mg PO before breakfast, after lunch, after dinner, and at bedtime Day 4: 4 mg PO before breakfast, after lunch, and at bedtime Day 5: 4 mg PO before breakfast and at bedtime Day 6: 4 mg PO before breakfast  numbness and tingling of bilateral legs:  Differential includes lumbar radiculopathy from spinal stenosis, bone pain secondary to prostate mets, and idiopathic neuropathy. Will await results from bone scan and CT prior to MRI. Ordered prednisone and gabapentin at a higher dose to be restarted tomorrow after his imaging. Discussed the possibility of MRI and injections in the future. Ordered tramadol to be used at night if pain inhibits sleeping and activities of daily living. Patient education given to patient and his daughter concerning potential for drowsiness and to take fall precautions and monitor first dose. Pt aware of potential dependence and abuse.   [place] controlled substance  database reviewed with no concerns.   Congestion - Counseled patient prednisone for neuropathy may also improve symptoms. After prednisone is completed encouraged him to try loratadine which he has available at home. Lower chest pain is not consistent with cardiac etiology and likely secondary to irritation of his ribs and diaphragm with recent coughing episode. Prednisone will also help with this pain. He should return if pain increases in severity or changes character - pressure or radiation.  Weight loss - Discussed with patient that weight loss could be due to several things such as decreased appetite and dietary intake with aging as well as due to cancer recurrence. Will followup on this pending bone scan results. Encouraged him to increase food intake and discussed supplementing meals with ensure or boost.  ..Spent 30 minutes with patient and greater than 50 percent of visit spent counseling patient regarding treatment plan.

## 2018-05-21 NOTE — Patient Instructions (Signed)
Start 3 tablets of gabapentin at bedtime.  Tramadol as needed for pain.  Prednisone taper.

## 2018-05-23 ENCOUNTER — Encounter: Payer: Self-pay | Admitting: Physician Assistant

## 2018-05-23 DIAGNOSIS — R634 Abnormal weight loss: Secondary | ICD-10-CM | POA: Insufficient documentation

## 2018-05-23 DIAGNOSIS — M48061 Spinal stenosis, lumbar region without neurogenic claudication: Secondary | ICD-10-CM | POA: Insufficient documentation

## 2018-05-23 DIAGNOSIS — C61 Malignant neoplasm of prostate: Secondary | ICD-10-CM | POA: Insufficient documentation

## 2018-05-27 ENCOUNTER — Telehealth: Payer: Self-pay

## 2018-05-27 NOTE — Telephone Encounter (Signed)
Sounds like normal variant of reaction to prednisone. She could also expect increase in anxiety, increase in BP, insomnia, all go away once prednisone is done. It is used to treat allergic reaction so very rare to have an allergic reaction to prednisone.

## 2018-05-27 NOTE — Telephone Encounter (Signed)
Pt's daughter called again- states that she spoke with the pharmacist and it seemed to be a normal reaction but advised pt to not take any more until they heard from provider.   Caren Griffins state that pt took all 6 prednisone pills yesterday with no reaction.   I advised Caren Griffins I would speak with provider but for her to hold off until she hears back from office. Caren Griffins agreed and states pt's SX from this morning have gone away.

## 2018-05-27 NOTE — Telephone Encounter (Signed)
Per provider, as long as pt is not having SOB, chest pain, or elevated HR ok to continue to give. Mark Mora states that since Prednisone is given to "tame" allergic reactions it is very rare to have allergic reactions to Prednisone. States these SX should improve since his dose is tapering down.   Mark Mora requests I make Mark Mora aware of other common side effects of Prednisone such as Increase anxiety not sleeping well.   I have advised Mark Mora. She will continue to give pt Prednisne as directed. I advised pt is pt develops increased HR, SOB, or chest pain, call the office if we are open and if it occurs after hours and they feel he needs attention, take pt to Urgent Care or ER.   Mark Mora agreeable and very thankful. No further needs at this time.

## 2018-05-27 NOTE — Telephone Encounter (Signed)
Mark Mora called about her dad having side effects to prednisone. She states he took his first dose yesterday and one again this morning. He is having warm head and face also red/pink in face. Denies SOB, mouth swelling, fever or rash. Please advise.

## 2018-05-30 ENCOUNTER — Telehealth: Payer: Self-pay

## 2018-05-30 MED ORDER — NYSTATIN 100000 UNIT/GM EX CREA: 1.0000 "application " | TOPICAL_CREAM | Freq: Two times a day (BID) | CUTANEOUS | 1 refills | Status: DC

## 2018-05-30 NOTE — Telephone Encounter (Signed)
Sent nystatin cream to use twice a day as needed for rash.   Have you guys been called about bone scan. I have not been able to locate report.

## 2018-05-30 NOTE — Telephone Encounter (Signed)
Pt's daughter Caren Griffins advised

## 2018-05-30 NOTE — Telephone Encounter (Signed)
Pt's daughter called stating that since he has been on Prednisone, he has developed a red itchy rash between skin folds on his stomach. Daughter describes rash as about 2 inches long and 2 inches wide, really red, and very itchy. Pt has 2 days left of Prednisone.  Wanting to know if pt can have medication called in.   Please advise

## 2018-06-11 ENCOUNTER — Other Ambulatory Visit: Payer: Self-pay | Admitting: Physician Assistant

## 2018-06-11 DIAGNOSIS — E7800 Pure hypercholesterolemia, unspecified: Secondary | ICD-10-CM

## 2018-06-11 DIAGNOSIS — I10 Essential (primary) hypertension: Secondary | ICD-10-CM

## 2018-06-11 DIAGNOSIS — I6521 Occlusion and stenosis of right carotid artery: Secondary | ICD-10-CM

## 2018-07-04 DIAGNOSIS — R739 Hyperglycemia, unspecified: Secondary | ICD-10-CM | POA: Insufficient documentation

## 2018-07-04 DIAGNOSIS — R7989 Other specified abnormal findings of blood chemistry: Secondary | ICD-10-CM | POA: Insufficient documentation

## 2018-07-04 DIAGNOSIS — R778 Other specified abnormalities of plasma proteins: Secondary | ICD-10-CM | POA: Insufficient documentation

## 2018-07-08 ENCOUNTER — Telehealth: Payer: Self-pay

## 2018-07-08 NOTE — Telephone Encounter (Signed)
Able to move follow up to Friday. Pt's daughter aware

## 2018-07-08 NOTE — Telephone Encounter (Signed)
I have some openings this Friday I think! See if you can get him in then.

## 2018-07-08 NOTE — Telephone Encounter (Signed)
I spoke to Mark Mora's daughter today concerning Mark Mora's need for a hospital follow up.   Per daughter, Mark Mora was admitted to [facility] for a stroke. He was discharged yesterday, 07-07-18. Next available appt with PCP was scheduled for two weeks out, on 07-22-18.  Mark Mora's daughter concerned that this was too far out, but I explained to Mark Mora this was the next available opening.   Daughter states that they are still waiting to hear from neurologist to be scheduled also.  Luvenia Starch, can you please review Mark Mora's hospital records and verify that he is okay to wait to be seen?  If appt needs to be changed, please let me know.  Thanks!

## 2018-07-09 MED ORDER — ACETAMINOPHEN 325 MG PO TABS
650.00 | ORAL_TABLET | ORAL | Status: DC
Start: ? — End: 2018-07-09

## 2018-07-09 MED ORDER — ATORVASTATIN CALCIUM 20 MG PO TABS
20.00 | ORAL_TABLET | ORAL | Status: DC
Start: 2018-07-07 — End: 2018-07-09

## 2018-07-09 MED ORDER — ALBUTEROL SULFATE (2.5 MG/3ML) 0.083% IN NEBU
2.50 | INHALATION_SOLUTION | RESPIRATORY_TRACT | Status: DC
Start: ? — End: 2018-07-09

## 2018-07-09 MED ORDER — ALUM & MAG HYDROXIDE-SIMETH 200-200-20 MG/5ML PO SUSP
30.00 | ORAL | Status: DC
Start: ? — End: 2018-07-09

## 2018-07-09 MED ORDER — AMLODIPINE BESYLATE 5 MG PO TABS
5.00 | ORAL_TABLET | ORAL | Status: DC
Start: 2018-07-08 — End: 2018-07-09

## 2018-07-09 MED ORDER — GENERIC EXTERNAL MEDICATION
Status: DC
Start: ? — End: 2018-07-09

## 2018-07-09 MED ORDER — MAGNESIUM HYDROXIDE 400 MG/5ML PO SUSP
15.00 | ORAL | Status: DC
Start: ? — End: 2018-07-09

## 2018-07-09 MED ORDER — HYDROCODONE-ACETAMINOPHEN 5-325 MG PO TABS
1.00 | ORAL_TABLET | ORAL | Status: DC
Start: ? — End: 2018-07-09

## 2018-07-09 MED ORDER — HYDRALAZINE HCL 20 MG/ML IJ SOLN
10.00 | INTRAMUSCULAR | Status: DC
Start: ? — End: 2018-07-09

## 2018-07-09 MED ORDER — LISINOPRIL 20 MG PO TABS
20.00 | ORAL_TABLET | ORAL | Status: DC
Start: 2018-07-08 — End: 2018-07-09

## 2018-07-09 MED ORDER — FAMOTIDINE 20 MG PO TABS
20.00 | ORAL_TABLET | ORAL | Status: DC
Start: 2018-07-07 — End: 2018-07-09

## 2018-07-09 MED ORDER — NITROGLYCERIN 0.4 MG SL SUBL
0.40 | SUBLINGUAL_TABLET | SUBLINGUAL | Status: DC
Start: ? — End: 2018-07-09

## 2018-07-09 MED ORDER — TAMSULOSIN HCL 0.4 MG PO CAPS
0.40 | ORAL_CAPSULE | ORAL | Status: DC
Start: 2018-07-08 — End: 2018-07-09

## 2018-07-09 MED ORDER — SODIUM CHLORIDE 0.9 % IV SOLN
10.00 | INTRAVENOUS | Status: DC
Start: ? — End: 2018-07-09

## 2018-07-09 MED ORDER — APIXABAN 5 MG PO TABS
10.00 | ORAL_TABLET | ORAL | Status: DC
Start: 2018-07-07 — End: 2018-07-09

## 2018-07-11 ENCOUNTER — Ambulatory Visit (INDEPENDENT_AMBULATORY_CARE_PROVIDER_SITE_OTHER): Payer: Medicare Other | Admitting: Physician Assistant

## 2018-07-11 ENCOUNTER — Encounter: Payer: Self-pay | Admitting: Physician Assistant

## 2018-07-11 VITALS — BP 102/58 | HR 81 | Temp 97.4°F | Ht 66.0 in | Wt 131.0 lb

## 2018-07-11 DIAGNOSIS — I10 Essential (primary) hypertension: Secondary | ICD-10-CM

## 2018-07-11 DIAGNOSIS — M48061 Spinal stenosis, lumbar region without neurogenic claudication: Secondary | ICD-10-CM | POA: Diagnosis not present

## 2018-07-11 DIAGNOSIS — N183 Chronic kidney disease, stage 3 unspecified: Secondary | ICD-10-CM

## 2018-07-11 DIAGNOSIS — I6300 Cerebral infarction due to thrombosis of unspecified precerebral artery: Secondary | ICD-10-CM | POA: Diagnosis not present

## 2018-07-11 DIAGNOSIS — I959 Hypotension, unspecified: Secondary | ICD-10-CM

## 2018-07-11 DIAGNOSIS — R202 Paresthesia of skin: Secondary | ICD-10-CM

## 2018-07-11 DIAGNOSIS — I2699 Other pulmonary embolism without acute cor pulmonale: Secondary | ICD-10-CM

## 2018-07-11 MED ORDER — TRAMADOL HCL 50 MG PO TABS: ORAL_TABLET | ORAL | 0 refills | Status: DC

## 2018-07-11 MED ORDER — GABAPENTIN 100 MG PO CAPS: 100.0000 mg | ORAL_CAPSULE | Freq: Three times a day (TID) | ORAL | 1 refills | Status: DC

## 2018-07-11 NOTE — Progress Notes (Signed)
Subjective:    Patient ID: Mark Mora, male    DOB: 1920-09-26, 84 y.o.   MRN: [id number]  HPI  Pt is a 84 yo male with recent hx of CVA and PE from ED visit on 07/09/18. He presents to the clinic with daughter. He presented to the ED due to sudden onset of left hand and LLE below the knee. AFter CTA head/neck RUL PE and MRI showed bilateral cerebral hemispheres. He overall is doing well. He has appt with neurologist on the 14th. He was started on eliquis and stopped ASA for now. His symptoms are better but still has bilateral LLE numbness and tingling.    Bone scan was done. Negative for any metastasis of prostate cancer.   Pt continues to have daily persistent back pain. Tylenol is just not helping like it used too.  .. Active Ambulatory Problems    Diagnosis Date Noted  . CKD (chronic kidney disease) stage 3, GFR 30-59 ml/min (HCC) 11/05/2017  . 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 Essential hypertension 11/05/2017  . Carotid atherosclerosis, right 11/05/2017  . Pure hypercholesterolemia 11/05/2017  . Screening for diabetes mellitus 11/05/2017  . Hearing loss due to cerumen impaction, bilateral 11/05/2017  . Ingrown toenail of left foot 03/20/2018  . Toenail fungus 03/20/2018  . Hearing loss of left ear due to cerumen impaction 03/20/2018  . Gastroesophageal reflux disease with esophagitis 03/23/2018  . Benign prostatic hyperplasia with urinary frequency 03/23/2018  . Neuropathy 03/23/2018  . History of prostate cancer 05/05/2018  . Numbness and tingling of both feet 05/05/2018  . Prostate cancer ([place]) 05/23/2018  . Weight loss 05/23/2018  . Spinal stenosis of lumbar region 05/23/2018  . Pulmonary embolus ([place]) 07/15/2018  . Cerebrovascular accident (CVA) due to thrombosis of precerebral artery ([place]) 07/15/2018  . Paresthesias 07/16/2018  . Hypotension 07/16/2018   Resolved Ambulatory Problems    Diagnosis Date Noted  . No Resolved Ambulatory  Problems   Past Medical History:  Diagnosis Date  . Carotid atherosclerosis, right 11/05/2017  . CKD (chronic kidney disease) stage 3, GFR 30-59 ml/min (HCC) 11/05/2017  . Essential hypertension 11/05/2017  . Hearing loss due to cerumen impaction, bilateral 11/05/2017  . Hyperlipidemia   . Hypertension   . Pure hypercholesterolemia 11/05/2017  . Screening for diabetes mellitus 11/05/2017  . Varicose veins of right lower extremity with edema 11/05/2017      Review of Systems    see HPI.  Objective:   Physical Exam  Constitutional: He is oriented to person, place, and time. He appears well-developed and well-nourished.  In a wheelchair.   HENT:  Head: Normocephalic and atraumatic.  Cardiovascular: Normal rate and regular rhythm.  Pulmonary/Chest: Effort normal and breath sounds normal.  Neurological: He is alert and oriented to person, place, and time.  Gait weak and unstable.   Psychiatric: He has a normal mood and affect. His behavior is normal.          Assessment & Plan:  Marland KitchenMarland KitchenDiagnoses and all orders for this visit:  Cerebrovascular accident (CVA) due to thrombosis of precerebral artery ([place])  Spinal stenosis of lumbar region, unspecified whether neurogenic claudication present -     traMADol (ULTRAM) 50 MG tablet; Take one tablet twice a day. -     gabapentin (NEURONTIN) 100 MG capsule; Take 1 capsule (100 mg total) by mouth 3 (three) times daily.  Essential hypertension  CKD (chronic kidney disease) stage 3, GFR 30-59 ml/min (HCC)  Other  acute pulmonary embolism without acute cor pulmonale (HCC)  Paresthesias -     gabapentin (NEURONTIN) 100 MG capsule; Take 1 capsule (100 mg total) by mouth 3 (three) times daily.  Hypotension, unspecified hypotension type   Continue with follow up with neurologist on the 14th. Stay on eliquis. No ASA.   Stop norvasc due to low BP. Recheck at neurologist and one month.  Numbness and tingling is bilateral concerned this  could be coming from spinal stenosis and pain not necessarily stroke symptoms. Start gabapentin regularly and tramadol for pain. Sedation warning given for tramadol. Hopefully this will help pain some.

## 2018-07-11 NOTE — Patient Instructions (Addendum)
Start gabapentin 1 tablet three times a day. Start with once a day and increase by 1 tablet every three days until gets to 3 times a day.   Continue on eliquis. No ASA.   Stop norvasc.   Start tramadol for pain. Twice a day.   Follow up in 1 months.

## 2018-07-15 ENCOUNTER — Encounter: Payer: Self-pay | Admitting: Physician Assistant

## 2018-07-15 DIAGNOSIS — I6300 Cerebral infarction due to thrombosis of unspecified precerebral artery: Secondary | ICD-10-CM | POA: Insufficient documentation

## 2018-07-15 DIAGNOSIS — I2699 Other pulmonary embolism without acute cor pulmonale: Secondary | ICD-10-CM | POA: Insufficient documentation

## 2018-07-16 ENCOUNTER — Telehealth: Payer: Self-pay | Admitting: Physician Assistant

## 2018-07-16 DIAGNOSIS — I959 Hypotension, unspecified: Secondary | ICD-10-CM | POA: Insufficient documentation

## 2018-07-16 DIAGNOSIS — R202 Paresthesia of skin: Secondary | ICD-10-CM | POA: Insufficient documentation

## 2018-07-16 NOTE — Telephone Encounter (Signed)
Let patient know that sports medicine consult suggested to see how tramadol and gabapentin work for pain and numbness and tingling. If no improvement then suggested you see them for intervention. This is regarding low back pain and numbness and tingling of lower extermities.

## 2018-07-22 ENCOUNTER — Inpatient Hospital Stay: Payer: Medicare Other | Admitting: Physician Assistant

## 2018-07-22 ENCOUNTER — Telehealth: Payer: Self-pay

## 2018-07-22 NOTE — Telephone Encounter (Signed)
Pt's daughter advised of med change. Jade's schedule has been opened for Thursday AM only ad pt scheduled for hosp f/u at 9:00

## 2018-07-22 NOTE — Telephone Encounter (Signed)
Caren Griffins called to let Mark Mora know she had to take pt to hospital yesterday due to high blood pressure. Blood Pressure was running in the 200s and would not go down. At [facility] states they also found a shadow on pt's lung and have given him antibiotics.  Caren Griffins states this morning his BP is 175/80 and he is having leg numbness/pain. Caren Griffins states he is resting with feet elevated and she will recheck his BP through out the day.  Caren Griffins wanting to know what they should do about a hospital follow up since Mark Mora will be out of office.

## 2018-07-22 NOTE — Telephone Encounter (Signed)
Pt should take 1 and 1/2 tablet to equal 30mg  of lisinopril daily. My Thursday was never opened up for 1/2 day (am only) this week. Will you open this up and offer them a time on Thursday?

## 2018-07-24 ENCOUNTER — Ambulatory Visit (INDEPENDENT_AMBULATORY_CARE_PROVIDER_SITE_OTHER): Payer: Medicare Other | Admitting: Physician Assistant

## 2018-07-24 ENCOUNTER — Encounter: Payer: Self-pay | Admitting: Physician Assistant

## 2018-07-24 VITALS — BP 156/84 | HR 80 | Ht 66.0 in | Wt 143.0 lb

## 2018-07-24 DIAGNOSIS — R918 Other nonspecific abnormal finding of lung field: Secondary | ICD-10-CM

## 2018-07-24 DIAGNOSIS — R1011 Right upper quadrant pain: Secondary | ICD-10-CM

## 2018-07-24 DIAGNOSIS — I10 Essential (primary) hypertension: Secondary | ICD-10-CM

## 2018-07-24 DIAGNOSIS — R1012 Left upper quadrant pain: Secondary | ICD-10-CM

## 2018-07-24 DIAGNOSIS — R142 Eructation: Secondary | ICD-10-CM | POA: Diagnosis not present

## 2018-07-24 DIAGNOSIS — K5901 Slow transit constipation: Secondary | ICD-10-CM

## 2018-07-24 DIAGNOSIS — H6123 Impacted cerumen, bilateral: Secondary | ICD-10-CM

## 2018-07-24 MED ORDER — AMLODIPINE BESYLATE 2.5 MG PO TABS: 2.5000 mg | ORAL_TABLET | Freq: Every day | ORAL | 1 refills | Status: DC

## 2018-07-24 MED ORDER — OMEPRAZOLE 40 MG PO CPDR: 40.0000 mg | DELAYED_RELEASE_CAPSULE | Freq: Every day | ORAL | 2 refills | Status: DC

## 2018-07-24 NOTE — Patient Instructions (Addendum)
Start omeprazole in morning.  Add amlodipine back at 2.5mg  daily. Go down to lisinopril once a day.  miralax 1 capful twice a day to help with bowel movements.

## 2018-07-24 NOTE — Progress Notes (Signed)
Subjective:    Patient ID: Mark Mora, male    DOB: 1920/08/22, 84 y.o.   MRN: [id number]  HPI Pt is a 84 yo male with recent hx CVA, PE who went to ED for elevated BP after physical therapist checked BP and was elevated. They did not do anything and sent him home to follow up with PCP. Last visit we stopped norvasc due to BP being on the low side. He is only on lisinopril but he did take 1 and 1/2 tablet today and discussed. There was concern for pneumonia due to opacity on the CXR. Thought it could be scarring as well. Treated with zpak. No fever, chills, body aches.   He is having some episodes of pressure in his upper abdomen and into chest. Seems to come and go. Present for a few months but seems to be worsening. He is burping a lot. Seems to happen more at night. At times he feels more weak but then resolves. No CP.   More constipated than normal. Been 3 days without bowel movement.    Active Ambulatory Problems    Diagnosis Date Noted  . CKD (chronic kidney disease) stage 3, GFR 30-59 ml/min (HCC) 11/05/2017  . 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 Essential hypertension 11/05/2017  . Carotid atherosclerosis, right 11/05/2017  . Pure hypercholesterolemia 11/05/2017  . Screening for diabetes mellitus 11/05/2017  . Hearing loss due to cerumen impaction, bilateral 11/05/2017  . Ingrown toenail of left foot 03/20/2018  . Toenail fungus 03/20/2018  . Hearing loss of left ear due to cerumen impaction 03/20/2018  . Gastroesophageal reflux disease with esophagitis 03/23/2018  . Benign prostatic hyperplasia with urinary frequency 03/23/2018  . Neuropathy 03/23/2018  . History of prostate cancer 05/05/2018  . Numbness and tingling of both feet 05/05/2018  . Prostate cancer ([place]) 05/23/2018  . Weight loss 05/23/2018  . Spinal stenosis of lumbar region 05/23/2018  . Pulmonary embolus ([place]) 07/15/2018  . Cerebrovascular accident (CVA) due to thrombosis of  precerebral artery ([place]) 07/15/2018  . Paresthesias 07/16/2018  . Hypotension 07/16/2018  . Slow transit constipation 07/26/2018  . Bilateral upper abdominal discomfort 07/26/2018  . Burping 07/26/2018  . Opacity of lung on imaging study 07/26/2018   Resolved Ambulatory Problems    Diagnosis Date Noted  . No Resolved Ambulatory Problems   Past Medical History:  Diagnosis Date  . Hyperlipidemia   . Hypertension      Review of Systems    see HPI.  Objective:   Physical Exam  Constitutional: He is oriented to person, place, and time. He appears well-developed.  In wheelchair.  HENT:  Head: Normocephalic and atraumatic.  Bilateral external canals impacted with cerumen. After irrigation TM's clear.   Cardiovascular: Normal rate and regular rhythm.  Pulmonary/Chest: Effort normal and breath sounds normal.  Abdominal: Soft. Bowel sounds are normal. He exhibits no distension. There is no tenderness. There is no rebound and no guarding.  Neurological: He is alert and oriented to person, place, and time.  Psychiatric: He has a normal mood and affect. His behavior is normal.          Assessment & Plan:  Marland KitchenMarland KitchenDiagnoses and all orders for this visit:  Essential hypertension -     amLODipine (NORVASC) 2.5 MG tablet; Take 1 tablet (2.5 mg total) by mouth daily.  Opacity of lung on imaging study -     DG Chest 2 View; Future  Burping -  omeprazole (PRILOSEC) 40 MG capsule; Take 1 capsule (40 mg total) by mouth daily.  Bilateral upper abdominal discomfort -     omeprazole (PRILOSEC) 40 MG capsule; Take 1 capsule (40 mg total) by mouth daily.  Slow transit constipation  Bilateral impacted cerumen   BP still elevated. Stay as 1 tablet of lisinopril and added back norvasc 2.5mg  daily. Recheck in 2 weeks BP.   Need to confirm opacity on lungs have cleared. After finishing zpak and wait 5 days. Recheck CXR. Ordered CXR.   Concern feeling in chest could be acid reflux. He is  burping a lot. Added omeprazole. Continue zantac.follow up in 2 weeks.    Discussed miralax 1 capful bid as needed for stooling regularly. Follow up if no improvement.   Marland Kitchen.Indication: Cerumen impaction of the ear(s)  Medical necessity statement: On physical examination, cerumen impairs clinically significant portions of the external auditory canal, and tympanic membrane. Noted obstructive, copious cerumen that cannot be removed without magnification and instrumentations requiring physician skills Consent: Discussed benefits and risks of procedure and verbal consent obtained Procedure: Patient was prepped for the procedure. Utilized an otoscope to assess and take note of the ear canal, the tympanic membrane, and the presence, amount, and placement of the cerumen. Gentle water irrigation and soft plastic curette was utilized to remove cerumen.  Post procedure examination: shows cerumen was completely removed. Patient tolerated procedure well. The patient is made aware that they may experience temporary vertigo, temporary hearing loss, and temporary discomfort. If these symptom last for more than 24 hours to call the clinic or proceed to the ED.

## 2018-07-26 ENCOUNTER — Encounter: Payer: Self-pay | Admitting: Physician Assistant

## 2018-07-26 DIAGNOSIS — R1012 Left upper quadrant pain: Secondary | ICD-10-CM

## 2018-07-26 DIAGNOSIS — K5901 Slow transit constipation: Secondary | ICD-10-CM | POA: Insufficient documentation

## 2018-07-26 DIAGNOSIS — R142 Eructation: Secondary | ICD-10-CM | POA: Insufficient documentation

## 2018-07-26 DIAGNOSIS — R1011 Right upper quadrant pain: Secondary | ICD-10-CM | POA: Insufficient documentation

## 2018-07-26 DIAGNOSIS — R918 Other nonspecific abnormal finding of lung field: Secondary | ICD-10-CM | POA: Insufficient documentation

## 2018-08-01 ENCOUNTER — Emergency Department
Admission: EM | Admit: 2018-08-01 | Discharge: 2018-08-01 | Disposition: A | Discharge status: Home / Self Care | Payer: Medicare Other | Source: Home / Self Care | Attending: Family Medicine | Admitting: Family Medicine

## 2018-08-01 ENCOUNTER — Other Ambulatory Visit: Payer: Self-pay

## 2018-08-01 DIAGNOSIS — R309 Painful micturition, unspecified: Secondary | ICD-10-CM

## 2018-08-01 LAB — POCT URINALYSIS DIP (MANUAL ENTRY)
BILIRUBIN UA: NEGATIVE
BILIRUBIN UA: NEGATIVE mg/dL
Glucose, UA: NEGATIVE mg/dL
Leukocytes, UA: NEGATIVE
NITRITE UA: NEGATIVE
PH UA: 7 (ref 5.0–8.0)
Protein Ur, POC: 30 mg/dL — AB
RBC UA: NEGATIVE
Spec Grav, UA: 1.01 (ref 1.010–1.025)
Urobilinogen, UA: 0.2 E.U./dL

## 2018-08-01 MED ORDER — TAMSULOSIN HCL 0.4 MG PO CAPS: 0.4000 mg | ORAL_CAPSULE | Freq: Every day | ORAL | 0 refills | Status: DC

## 2018-08-01 NOTE — Discharge Instructions (Signed)
°  You will be notified of the test results for your PSA (prostate) and for the urine culture by Monday.  If you do not hear from office by Monday, you may call to check on the status of the results [phone].  Please schedule a follow up visit with your primary care provider and/or urology next week for recheck of symptoms.

## 2018-08-01 NOTE — ED Triage Notes (Signed)
Pt c/o burning with urination x 1 week.

## 2018-08-01 NOTE — ED Provider Notes (Signed)
Mark Mora CARE    CSN: [id number] Arrival date & time: 08/01/18  1455     History   Chief Complaint Chief Complaint  Patient presents with  . Dysuria    HPI Mark Mora is a 84 y.o. male.   HPI  Mark Mora is a 84 y.o. male presenting to UC accompanied by his daughter with c/o burning with urination that started 1 week ago.  He does have a hx of BPH but has not believe he has been taking his Flomax but has taken his furosemide.  Denies abdominal pain or new back pain. No fever or chills. No blood in urine. Denies penile discharge or rashes. He did have a CVA recently but denies being catheterized.  He saw a urologist in [place] in May of this year for his prostate but has not seen one recently.    Past Medical History:  Diagnosis Date  . Carotid atherosclerosis, right 11/05/2017  . CKD (chronic kidney disease) stage 3, GFR 30-59 ml/min (HCC) 11/05/2017  . Essential hypertension 11/05/2017  . Hearing loss due to cerumen impaction, bilateral 11/05/2017  . Hyperlipidemia   . Hypertension   . Pure hypercholesterolemia 11/05/2017  . Screening for diabetes mellitus 11/05/2017  . Varicose veins of right lower extremity with edema 11/05/2017    Patient Active Problem List   Diagnosis Date Noted  . Slow transit constipation 07/26/2018  . Bilateral upper abdominal discomfort 07/26/2018  . Burping 07/26/2018  . Opacity of lung on imaging study 07/26/2018  . Paresthesias 07/16/2018  . Hypotension 07/16/2018  . Pulmonary embolus ([place]) 07/15/2018  . Cerebrovascular accident (CVA) due to thrombosis of precerebral artery ([place]) 07/15/2018  . Prostate cancer ([place]) 05/23/2018  . Weight loss 05/23/2018  . Spinal stenosis of lumbar region 05/23/2018  . History of prostate cancer 05/05/2018  . Numbness and tingling of both feet 05/05/2018  . Gastroesophageal reflux disease with esophagitis 03/23/2018  . Benign prostatic hyperplasia with urinary frequency 03/23/2018    . Neuropathy 03/23/2018  . Ingrown toenail of left foot 03/20/2018  . Toenail fungus 03/20/2018  . Hearing loss of left ear due to cerumen impaction 03/20/2018  . CKD (chronic kidney disease) stage 3, GFR 30-59 ml/min (HCC) 11/05/2017  . 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 Essential hypertension 11/05/2017  . Carotid atherosclerosis, right 11/05/2017  . Pure hypercholesterolemia 11/05/2017  . Screening for diabetes mellitus 11/05/2017  . Hearing loss due to cerumen impaction, bilateral 11/05/2017    Past Surgical History:  Procedure Laterality Date  . HERNIA REPAIR         Home Medications    Prior to Admission medications   Medication Sig Start Date End Date Taking? Authorizing Provider  amLODipine (NORVASC) 2.5 MG tablet Take 1 tablet (2.5 mg total) by mouth daily. 07/24/18   Breeback, Royetta Car, PA-C  apixaban (ELIQUIS) 5 MG TABS tablet 10 mg (2 tablets) oral 2 times a day x 7 days followed by 5 mg (1 tablet) 2 times a day for completion of therapy duration. 07/07/18   [provider]  atorvastatin (LIPITOR) 40 MG tablet TAKE 1 TABLET BY MOUTH  DAILY 06/11/18   Breeback, Jade L, PA-C  ELIQUIS 5 MG TABS tablet TAKE 2 TABLETS BY MOUTH TWICE DAILY FOR 7 DAYS AND THEN 1 TWICE DAILY FOR COMPLETION OF THERAPY DURATION 07/07/18   [provider]  furosemide (LASIX) 20 MG tablet Take 1 tablet (20 mg total) by mouth daily. 05/07/18  Breeback, Jade L, PA-C  gabapentin (NEURONTIN) 100 MG capsule Take 1 capsule (100 mg total) by mouth 3 (three) times daily. 07/11/18   Breeback, Jade L, PA-C  lisinopril (PRINIVIL,ZESTRIL) 20 MG tablet TAKE 1 TABLET BY MOUTH  DAILY 06/11/18   Breeback, Jade L, PA-C  nystatin cream (MYCOSTATIN) Apply 1 application topically 2 (two) times daily. 05/30/18   Breeback, Royetta Car, PA-C  omeprazole (PRILOSEC) 40 MG capsule Take 1 capsule (40 mg total) by mouth daily. 07/24/18   Breeback, Royetta Car, PA-C  ranitidine (ZANTAC) 150 MG tablet  Take 1 tablet (150 mg total) by mouth 2 (two) times daily. 03/20/18   Breeback, Royetta Car, PA-C  tamsulosin (FLOMAX) 0.4 MG CAPS capsule Take 1 capsule (0.4 mg total) by mouth daily. 03/20/18   Breeback, Royetta Car, PA-C  tamsulosin (FLOMAX) 0.4 MG CAPS capsule Take 1 capsule (0.4 mg total) by mouth daily. 08/01/18   Noe Gens, PA-C  traMADol (ULTRAM) 50 MG tablet Take one tablet twice a day. 07/11/18   Donella Stade, PA-C    Family History History reviewed. No pertinent family history.  Social History Social History   Tobacco Use  . Smoking status: Never Smoker  . Smokeless tobacco: Never Used  Substance Use Topics  . Alcohol use: No    Frequency: Never  . Drug use: No     Allergies   Patient has no known allergies.   Review of Systems Review of Systems  Constitutional: Negative for chills and fever.  Gastrointestinal: Negative for abdominal pain, nausea and vomiting.  Genitourinary: Positive for dysuria. Negative for decreased urine volume, discharge, frequency, hematuria, penile pain, testicular pain and urgency.  Musculoskeletal: Negative for back pain.     Physical Exam Triage Vital Signs ED Triage Vitals  Enc Vitals Group     BP 08/01/18 1536 122/68     Pulse Rate 08/01/18 1536 80     Resp --      Temp 08/01/18 1536 98.1 F (36.7 C)     Temp Source 08/01/18 1536 Oral     SpO2 08/01/18 1536 97 %     Weight 08/01/18 1537 143 lb (64.9 kg)     Height 08/01/18 1537 5\' 6"  (1.676 m)     Head Circumference --      Peak Flow --      Pain Score 08/01/18 1536 0     Pain Loc --      Pain Edu? --      Excl. in [place]? --    No data found.  Updated Vital Signs BP 122/68 (BP Location: Right Arm)   Pulse 80   Temp 98.1 F (36.7 C) (Oral)   Ht 5\' 6"  (1.676 m)   Wt 143 lb (64.9 kg)   SpO2 97%   BMI 23.08 kg/m   Visual Acuity Right Eye Distance:   Left Eye Distance:   Bilateral Distance:    Right Eye Near:   Left Eye Near:    Bilateral Near:     Physical Exam    Constitutional: He is oriented to person, place, and time. He appears well-developed and well-nourished. No distress.  HENT:  Head: Normocephalic and atraumatic.  Eyes: EOM are normal.  Neck: Normal range of motion.  Cardiovascular: Normal rate and regular rhythm.  Pulmonary/Chest: Effort normal and breath sounds normal. No stridor. No respiratory distress. He has no wheezes. He has no rales.  Abdominal: Soft. He exhibits no distension. There is no tenderness. There is  no CVA tenderness.  Genitourinary:  Genitourinary Comments: deferred  Musculoskeletal: Normal range of motion.  Neurological: He is alert and oriented to person, place, and time.  Skin: Skin is warm and dry. He is not diaphoretic.  Psychiatric: He has a normal mood and affect. His behavior is normal.  Nursing note and vitals reviewed.    UC Treatments / Results  Labs (all labs ordered are listed, but only abnormal results are displayed) Labs Reviewed  POCT URINALYSIS DIP (MANUAL ENTRY) - Abnormal; Notable for the following components:      Result Value   Protein Ur, POC =30 (*)    All other components within normal limits  URINE CULTURE  PSA    EKG None  Radiology No results found.  Procedures Procedures (including critical care time)  Medications Ordered in UC Medications - No data to display  Initial Impression / Assessment and Plan / UC Course  I have reviewed the triage vital signs and the nursing notes.  Pertinent labs & imaging results that were available during my care of the patient were reviewed by me and considered in my medical decision making (see chart for details).     UA: not consistent with a UTI Culture sent Discomfort could be due to his BPH Will print new prescription for Flomax. Considered Azo, however, due to his hx of stage 3 CKD, will hold off.  Final Clinical Impressions(s) / UC Diagnoses   Final diagnoses:  Painful urination     Discharge Instructions      You  will be notified of the test results for your PSA (prostate) and for the urine culture by Monday.  If you do not hear from office by Monday, you may call to check on the status of the results [phone].  Please schedule a follow up visit with your primary care provider and/or urology next week for recheck of symptoms.     ED Prescriptions    Medication Sig Dispense Auth. Provider   tamsulosin (FLOMAX) 0.4 MG CAPS capsule Take 1 capsule (0.4 mg total) by mouth daily. 30 capsule Noe Gens, PA-C     Controlled Substance Prescriptions [place] Controlled Substance Registry consulted? Not Applicable   Tyrell Antonio 08/01/18 1932

## 2018-08-02 LAB — PSA: PSA: 30.8 ng/mL — ABNORMAL HIGH (ref <=4.0)

## 2018-08-02 LAB — URINE CULTURE
MICRO NUMBER:: 91009665
SPECIMEN QUALITY:: ADEQUATE

## 2018-08-04 ENCOUNTER — Telehealth: Payer: Self-pay | Admitting: Emergency Medicine

## 2018-08-04 ENCOUNTER — Other Ambulatory Visit: Payer: Self-pay | Admitting: Physician Assistant

## 2018-08-04 MED ORDER — FLUCONAZOLE 150 MG PO TABS: 150.0000 mg | ORAL_TABLET | Freq: Once | ORAL | 0 refills | Status: AC

## 2018-08-04 NOTE — Telephone Encounter (Signed)
Please review labs from the urgent care. Daughter is concerned that he may need medication. Please advise.

## 2018-08-04 NOTE — Telephone Encounter (Signed)
Called patient. Urine culture normal. He just finished abx. Could be yeast causing burning will treat with diflucan. Let me know how doing in next few days.

## 2018-08-05 NOTE — Telephone Encounter (Signed)
Patient advised.

## 2018-08-07 ENCOUNTER — Encounter: Payer: Self-pay | Admitting: Physician Assistant

## 2018-08-07 ENCOUNTER — Ambulatory Visit (INDEPENDENT_AMBULATORY_CARE_PROVIDER_SITE_OTHER): Payer: Medicare Other | Admitting: Physician Assistant

## 2018-08-07 ENCOUNTER — Ambulatory Visit (INDEPENDENT_AMBULATORY_CARE_PROVIDER_SITE_OTHER): Payer: Medicare Other

## 2018-08-07 VITALS — BP 129/73 | HR 89 | Temp 98.2°F | Wt 131.0 lb

## 2018-08-07 DIAGNOSIS — R062 Wheezing: Secondary | ICD-10-CM | POA: Diagnosis not present

## 2018-08-07 DIAGNOSIS — J22 Unspecified acute lower respiratory infection: Secondary | ICD-10-CM

## 2018-08-07 DIAGNOSIS — R918 Other nonspecific abnormal finding of lung field: Secondary | ICD-10-CM

## 2018-08-07 MED ORDER — DOXYCYCLINE HYCLATE 100 MG PO TABS: 100.0000 mg | ORAL_TABLET | Freq: Two times a day (BID) | ORAL | 0 refills | Status: AC

## 2018-08-07 MED ORDER — CEFUROXIME AXETIL 500 MG PO TABS: 500.0000 mg | ORAL_TABLET | Freq: Two times a day (BID) | ORAL | 0 refills | Status: AC

## 2018-08-07 MED ORDER — BENZONATATE 200 MG PO CAPS: 200.0000 mg | ORAL_CAPSULE | Freq: Every evening | ORAL | 0 refills | Status: DC | PRN

## 2018-08-07 MED ORDER — GUAIFENESIN ER 600 MG PO TB12: 600.0000 mg | ORAL_TABLET | Freq: Two times a day (BID) | ORAL | Status: DC

## 2018-08-07 NOTE — Patient Instructions (Addendum)
Plan: Chest x-ray tonight Pick up antibiotics, but do not start yet Start plain Mucinex (no DM or multi-symptom) to help make cough more productive. Double check package of Coricidin. Do not combine if they both contain an expectorant Start Tessalon at bedtime as needed for cough  Viral Respiratory Infection A respiratory infection is an illness that affects part of the respiratory system, such as the lungs, nose, or throat. Most respiratory infections are caused by either viruses or bacteria. A respiratory infection that is caused by a virus is called a viral respiratory infection. Common types of viral respiratory infections include:  A cold.  The flu (influenza).  A respiratory syncytial virus (RSV) infection.  How do I know if I have a viral respiratory infection? Most viral respiratory infections cause:  A stuffy or runny nose.  Yellow or green nasal discharge.  A cough.  Sneezing.  Fatigue.  Achy muscles.  A sore throat.  Sweating or chills.  A fever.  A headache.  How are viral respiratory infections treated? If influenza is diagnosed early, it may be treated with an antiviral medicine that shortens the length of time a person has symptoms. Symptoms of viral respiratory infections may be treated with over-the-counter and prescription medicines, such as:  Expectorants. These make it easier to cough up mucus.  Decongestant nasal sprays.  Health care providers do not prescribe antibiotic medicines for viral infections. This is because antibiotics are designed to kill bacteria. They have no effect on viruses. How do I know if I should stay home from work or school? To avoid exposing others to your respiratory infection, stay home if you have:  A fever.  A persistent cough.  A sore throat.  A runny nose.  Sneezing.  Muscles aches.  Headaches.  Fatigue.  Weakness.  Chills.  Sweating.  Nausea.  Follow these instructions at home:  Rest as  much as possible.  Take over-the-counter and prescription medicines only as told by your health care provider.  Drink enough fluid to keep your urine clear or pale yellow. This helps prevent dehydration and helps loosen up mucus.  Gargle with a salt-water mixture 3-4 times per day or as needed. To make a salt-water mixture, completely dissolve -1 tsp of salt in 1 cup of warm water.  Use nose drops made from salt water to ease congestion and soften raw skin around your nose.  Do not drink alcohol.  Do not use tobacco products, including cigarettes, chewing tobacco, and e-cigarettes. If you need help quitting, ask your health care provider. Contact a health care provider if:  Your symptoms last for 10 days or longer.  Your symptoms get worse over time.  You have a fever.  You have severe sinus pain in your face or forehead.  The glands in your jaw or neck become very swollen. Get help right away if:  You feel pain or pressure in your chest.  You have shortness of breath.  You faint or feel like you will faint.  You have severe and persistent vomiting.  You feel confused or disoriented. This information is not intended to replace advice given to you by your health care provider. Make sure you discuss any questions you have with your health care provider. Document Released: 09/05/2005 Document Revised: 05/03/2016 Document Reviewed: 05/04/2015 Elsevier Interactive Patient Education  [employer].

## 2018-08-07 NOTE — Progress Notes (Signed)
HPI:                                                                Mark Mora is a 84 y.o. male who presents to [facility] today for cough  Pleasant 84 yo M with complex PMH of recent right-sided PE on Eliquis, recent CVA, HTN, GERD, CKD, and prostate cancer who presents with productive cough and dyspnea on exertion for 3 days. Cough is productive of yellow and white sputum. He endorses some lower chest/upper abdominal chest "discomfort." He also c/o sinus pressure and ear fullness bilaterally. Denies fever, pleuritic chest pain or hemoptysis. He was recently hospitalized 7/26-7/31 for acute PE and CVA. CTA showed bilateral lower lobe atelectasis He was recently in the ED on 07/21/18 for elevated BP. Chest x-ray at outside facility at that time showed left basilar opacities (unable to view images). He was treated with a Z-pak for possible pneumonia.   Past Medical History:  Diagnosis Date  . Carotid atherosclerosis, right 11/05/2017  . CKD (chronic kidney disease) stage 3, GFR 30-59 ml/min (HCC) 11/05/2017  . Essential hypertension 11/05/2017  . Hearing loss due to cerumen impaction, bilateral 11/05/2017  . Hyperlipidemia   . Hypertension   . Pure hypercholesterolemia 11/05/2017  . Screening for diabetes mellitus 11/05/2017  . 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 HERNIA REPAIR     Social History   Tobacco Use  . Smoking status: Never Smoker  . Smokeless tobacco: Never Used  Substance Use Topics  . Alcohol use: No    Frequency: Never   family history is not on file.    ROS: negative except as noted in the HPI  Medications: Current Outpatient Medications  Medication Sig Dispense Refill  . amLODipine (NORVASC) 2.5 MG tablet Take 1 tablet (2.5 mg total) by mouth daily. 30 tablet 1  . apixaban (ELIQUIS) 5 MG TABS tablet 10 mg (2 tablets) oral  2 times a day x 7 days followed by 5 mg (1 tablet) 2 times a day for completion of therapy duration.    Marland Kitchen atorvastatin (LIPITOR) 40 MG tablet TAKE 1 TABLET BY MOUTH  DAILY 90 tablet 1  . ELIQUIS 5 MG TABS tablet TAKE 2 TABLETS BY MOUTH TWICE DAILY FOR 7 DAYS AND THEN 1 TWICE DAILY FOR COMPLETION OF THERAPY DURATION  0  . furosemide (LASIX) 20 MG tablet Take 1 tablet (20 mg total) by mouth daily. 90 tablet 1  . gabapentin (NEURONTIN) 100 MG capsule Take 1 capsule (100 mg total) by mouth 3 (three) times daily. 90 capsule 1  . lisinopril (PRINIVIL,ZESTRIL) 20 MG tablet TAKE 1 TABLET BY MOUTH  DAILY 90 tablet 1  . nystatin cream (MYCOSTATIN) Apply 1 application topically 2 (two) times daily. 60 g 1  . omeprazole (PRILOSEC) 40 MG capsule Take 1 capsule (40 mg total) by mouth daily. 30 capsule 2  . ranitidine (ZANTAC) 150 MG tablet Take 1 tablet (150 mg total) by mouth 2 (two) times daily. 180 tablet 3  . tamsulosin (FLOMAX) 0.4 MG CAPS capsule Take 1 capsule (0.4 mg total) by mouth daily. 90 capsule 3  . tamsulosin (FLOMAX) 0.4 MG CAPS capsule  Take 1 capsule (0.4 mg total) by mouth daily. 30 capsule 0  . traMADol (ULTRAM) 50 MG tablet Take one tablet twice a day. 60 tablet 0   No current facility-administered medications for this visit.    No Known Allergies     Objective:  BP 129/73   Pulse 89   Temp 98.2 F (36.8 C) (Oral)   Wt 131 lb (59.4 kg)   SpO2 96%   BMI 21.14 kg/m  Gen:  alert, not ill-appearing, no distress, appropriate for age HEENT: head normocephalic without obvious abnormality, conjunctiva and cornea clear, right external canal obstructed by cerumen, left TM pearly gray and semi-transparent, nasal mucosa pink, oropharynx clear, moist mucous membranes, neck supple, no cervical adenopathy, trachea midline Pulm: Normal work of breathing, normal phonation,expiratory wheezes of the left lung fields, clear to auscultation on the right side CV: Normal rate, regular rhythm, s1 and  s2 distinct, no murmurs, clicks or rubs  Neuro: alert and oriented x 3, slow shuffling gait, ambulates with walker Skin: intact, no rashes on exposed skin, no jaundice, no cyanosis   No results found for this or any previous visit (from the past 72 hour(s)). No results found.    Assessment and Plan: 84 y.o. male with   .Diagnoses and all orders for this visit:  Acute lower respiratory infection -     DG Chest 2 View -     doxycycline (VIBRA-TABS) 100 MG tablet; Take 1 tablet (100 mg total) by mouth 2 (two) times daily for 5 days. -     cefUROXime (CEFTIN) 500 MG tablet; Take 1 tablet (500 mg total) by mouth 2 (two) times daily with a meal for 5 days. -     benzonatate (TESSALON) 200 MG capsule; Take 1 capsule (200 mg total) by mouth at bedtime as needed for cough. -     guaiFENesin (MUCINEX) 600 MG 12 hr tablet; Take 1 tablet (600 mg total) by mouth 2 (two) times daily.  Opacity of lung on imaging study  Wheezing -     DG Chest 2 View   - afebrile, no tachypnea, no tachycardia, SpO2 96% on RA at rest, diffuse wheezes in the left lung fields, recent chest imaging showing basilar atelectasis and opacities - CXR to assess for infiltrate - multiple risk factors for pneumonia including age, co-morbidities, recent hospitalization, recent antibiotic use. CURB-65 score 1. I would have a low threshold to treat for pneumonia. I have sent Doxycycline + Cefuroxime to the pharmacy to cover for macrolide resistant organisms. Instructed patient/daughter to await results of CXR before starting antibiotic - counseled on supportive care - tessalon prn for nighttime cough   Patient education and anticipatory guidance given Patient agrees with treatment plan Follow-up in 5 days with PCP or sooner as needed if symptoms worsen or fail to improve  Darlyne Russian PA-C

## 2018-08-08 ENCOUNTER — Telehealth: Payer: Self-pay | Admitting: Physician Assistant

## 2018-08-08 NOTE — Telephone Encounter (Signed)
Notes recorded by Narda Rutherford, CMA on 08/08/2018 at 9:10 AM EDT Patient's daughter advised of results and recommendations.

## 2018-08-08 NOTE — Addendum Note (Signed)
Addended by: Darlyne Russian on: 08/08/2018 08:41 AM   Modules accepted: Orders

## 2018-08-08 NOTE — Telephone Encounter (Signed)
Received call from Radiology regarding imaging call report ... See chest xray from yesterday:  IMPRESSION: 1. Questionable small spiculated pulmonary nodule, seen on the lateral view only, neoplastic nodule not excluded. Recommend chest CT for further characterization. 2. Hyperexpanded lungs suggesting COPD. 3. No evidence of pneumonia or pulmonary edema. 4.  Aortic Atherosclerosis (ICD10-I70.0).  Routing to ordering Provider and PCP (since ordering Provider is not in office this am).

## 2018-08-08 NOTE — Telephone Encounter (Signed)
I sent the result note to Evoni and Jade this morning. Thanks!

## 2018-08-13 ENCOUNTER — Ambulatory Visit (INDEPENDENT_AMBULATORY_CARE_PROVIDER_SITE_OTHER): Payer: Medicare Other | Admitting: Physician Assistant

## 2018-08-13 ENCOUNTER — Ambulatory Visit (INDEPENDENT_AMBULATORY_CARE_PROVIDER_SITE_OTHER): Payer: Medicare Other

## 2018-08-13 ENCOUNTER — Encounter: Payer: Self-pay | Admitting: Physician Assistant

## 2018-08-13 VITALS — BP 134/62 | HR 76 | Ht 66.0 in | Wt 136.0 lb

## 2018-08-13 DIAGNOSIS — E871 Hypo-osmolality and hyponatremia: Secondary | ICD-10-CM

## 2018-08-13 DIAGNOSIS — R918 Other nonspecific abnormal finding of lung field: Secondary | ICD-10-CM

## 2018-08-13 DIAGNOSIS — R55 Syncope and collapse: Secondary | ICD-10-CM | POA: Diagnosis not present

## 2018-08-13 DIAGNOSIS — E875 Hyperkalemia: Secondary | ICD-10-CM | POA: Diagnosis not present

## 2018-08-13 DIAGNOSIS — N481 Balanitis: Secondary | ICD-10-CM

## 2018-08-13 DIAGNOSIS — J432 Centrilobular emphysema: Secondary | ICD-10-CM

## 2018-08-13 DIAGNOSIS — J439 Emphysema, unspecified: Secondary | ICD-10-CM

## 2018-08-13 MED ORDER — CLOTRIMAZOLE 2 % VA CREA: TOPICAL_CREAM | VAGINAL | 0 refills | Status: DC

## 2018-08-13 MED ORDER — APIXABAN 5 MG PO TABS
5.00 | ORAL_TABLET | ORAL | Status: DC
Start: 2018-08-11 — End: 2018-08-13

## 2018-08-13 MED ORDER — MORPHINE SULFATE (PF) 2 MG/ML IV SOLN
2.00 | INTRAVENOUS | Status: DC
Start: ? — End: 2018-08-13

## 2018-08-13 MED ORDER — DM-GUAIFENESIN ER 30-600 MG PO TB12
1.00 | ORAL_TABLET | ORAL | Status: DC
Start: 2018-08-11 — End: 2018-08-13

## 2018-08-13 MED ORDER — NITROGLYCERIN 0.4 MG SL SUBL
0.40 | SUBLINGUAL_TABLET | SUBLINGUAL | Status: DC
Start: ? — End: 2018-08-13

## 2018-08-13 MED ORDER — TRAMADOL HCL 50 MG PO TABS
50.00 | ORAL_TABLET | ORAL | Status: DC
Start: ? — End: 2018-08-13

## 2018-08-13 MED ORDER — ATORVASTATIN CALCIUM 20 MG PO TABS
20.00 | ORAL_TABLET | ORAL | Status: DC
Start: 2018-08-11 — End: 2018-08-13

## 2018-08-13 MED ORDER — AMLODIPINE BESYLATE 10 MG PO TABS
10.00 | ORAL_TABLET | ORAL | Status: DC
Start: ? — End: 2018-08-13

## 2018-08-13 MED ORDER — LISINOPRIL 10 MG PO TABS
10.00 | ORAL_TABLET | ORAL | Status: DC
Start: 2018-08-12 — End: 2018-08-13

## 2018-08-13 MED ORDER — GENERIC EXTERNAL MEDICATION
10.00 | Status: DC
Start: ? — End: 2018-08-13

## 2018-08-13 MED ORDER — GABAPENTIN 100 MG PO CAPS
100.00 | ORAL_CAPSULE | ORAL | Status: DC
Start: 2018-08-11 — End: 2018-08-13

## 2018-08-13 MED ORDER — ACETAMINOPHEN 325 MG PO TABS
650.00 | ORAL_TABLET | ORAL | Status: DC
Start: ? — End: 2018-08-13

## 2018-08-13 MED ORDER — FLUTICASONE PROPIONATE 50 MCG/ACT NA SUSP
1.00 | NASAL | Status: DC
Start: 2018-08-12 — End: 2018-08-13

## 2018-08-13 MED ORDER — FUROSEMIDE 20 MG PO TABS
20.00 | ORAL_TABLET | ORAL | Status: DC
Start: 2018-08-12 — End: 2018-08-13

## 2018-08-13 MED ORDER — PANTOPRAZOLE SODIUM 40 MG PO TBEC
40.00 | DELAYED_RELEASE_TABLET | ORAL | Status: DC
Start: 2018-08-12 — End: 2018-08-13

## 2018-08-13 MED ORDER — TAMSULOSIN HCL 0.4 MG PO CAPS
0.40 | ORAL_CAPSULE | ORAL | Status: DC
Start: 2018-08-12 — End: 2018-08-13

## 2018-08-13 MED ORDER — SODIUM CHLORIDE 0.9 % IV SOLN
10.00 | INTRAVENOUS | Status: DC
Start: ? — End: 2018-08-13

## 2018-08-13 MED ORDER — TRAMADOL HCL 50 MG PO TABS
50.00 | ORAL_TABLET | ORAL | Status: DC
Start: 2018-08-11 — End: 2018-08-13

## 2018-08-13 NOTE — Progress Notes (Signed)
Subjective:    Patient ID: Mark Mora, male    DOB: 28-Feb-1920, 84 y.o.   MRN: [id number]  HPI Pt is a 84 yo male with recent hx of CVA and PE, Emphysema, HTN who presents to the clinic with his daughter. He comes in to follow up after another ED visit for syncope.  This episode of syncope occurred after having a bowel movement and standing after.  He felt confused and weak.  He was evaluated by neurologist he did not suspect any seizure activity.  Another CT was done to rule out stroke. Echo showed normal LVEF.  Suspected a vasovagal syncope.  Today he feels better but concerned about what do when he has episodes of feeling weak and confused.   He also has some irritation around the head of his penis. Diflucan helped but still there. He also feels like foreskin is hanging "different".   Continues to have a dry cough but not SOB. He has CT scan today to look at area on CXR.   Active Ambulatory Problems    Diagnosis Date Noted  . CKD (chronic kidney disease) stage 3, GFR 30-59 ml/min (HCC) 11/05/2017  . 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 Essential hypertension 11/05/2017  . Carotid atherosclerosis, right 11/05/2017  . Pure hypercholesterolemia 11/05/2017  . Screening for diabetes mellitus 11/05/2017  . Hearing loss due to cerumen impaction, bilateral 11/05/2017  . Ingrown toenail of left foot 03/20/2018  . Toenail fungus 03/20/2018  . Hearing loss of left ear due to cerumen impaction 03/20/2018  . Gastroesophageal reflux disease with esophagitis 03/23/2018  . Benign prostatic hyperplasia with urinary frequency 03/23/2018  . Neuropathy 03/23/2018  . History of prostate cancer 05/05/2018  . Numbness and tingling of both feet 05/05/2018  . Prostate cancer ([place]) 05/23/2018  . Weight loss 05/23/2018  . Spinal stenosis of lumbar region 05/23/2018  . Pulmonary embolus ([place]) 07/15/2018  . Cerebrovascular accident (CVA) due to thrombosis of precerebral  artery ([place]) 07/15/2018  . Paresthesias 07/16/2018  . Hypotension 07/16/2018  . Slow transit constipation 07/26/2018  . Bilateral upper abdominal discomfort 07/26/2018  . Burping 07/26/2018  . Opacity of lung on imaging study 07/26/2018  . Emphysema lung ([place]) 08/15/2018   Resolved Ambulatory Problems    Diagnosis Date Noted  . No Resolved Ambulatory Problems   Past Medical History:  Diagnosis Date  . Hyperlipidemia   . Hypertension       Review of Systems See HPI.     Objective:   Physical Exam  Constitutional: He is oriented to person, place, and time. He appears well-nourished. No distress.  In a wheelchair.   HENT:  Head: Normocephalic and atraumatic.  Cardiovascular: Normal rate and regular rhythm.  Pulmonary/Chest: Effort normal and breath sounds normal.  Coarse breath sounds.   Genitourinary:  Genitourinary Comments: No circumcised. Small cyst like lump in foreskin 78mm approximately. Not tender. Scant white discharge around the head of penis with some erythema.   Neurological: He is alert and oriented to person, place, and time.  Skin:  1+ edema of bilateral ankles.   Psychiatric: He has a normal mood and affect. His behavior is normal.          Assessment & Plan:  Marland KitchenMarland KitchenDiagnoses and all orders for this visit:  Syncope and collapse -     BASIC METABOLIC PANEL WITH GFR  Hyponatremia -     BASIC METABOLIC PANEL WITH GFR  Vasovagal syncope -  BASIC METABOLIC PANEL WITH GFR  Hyperkalemia -     BASIC METABOLIC PANEL WITH GFR  Balanitis -     clotrimazole (GYNE-LOTRIMIN 3) 2 % vaginal cream; 1 application twice a day for 7 days.  Centrilobular emphysema (HCC)   BP looks good today.   Likely yeast around penis. Given cream to use. Reassured foreskin is nothing to worry about. Could be a little cyst in foreskin. If enlarges let me know.   Since his stroke and PE visit a few months ago patient has had multiple complications. His sodium and potassium  level have not been in check. They seem to improve with IV fluids but then drop again. He is taking lasix as needed for bilateral leg swelling with concern this could be causing the electrolyte issues. Also concerned about eliqus and this being medication induced. For now after recent ED visit lets get cmp to see where labs are heading. Likely we need to make some medication adjustments.   Will call with results of CT of chest. Cough is improving but an LAMA could help with cough due to appearance of emphysema.   Marland Kitchen.Spent 40 minutes with patient and greater than 50 percent of visit spent counseling patient regarding treatment plan.

## 2018-08-14 LAB — BASIC METABOLIC PANEL WITH GFR
BUN/Creatinine Ratio: 15 (calc) (ref 6–22)
BUN: 20 mg/dL (ref 7–25)
CALCIUM: 8.7 mg/dL (ref 8.6–10.3)
CO2: 24 mmol/L (ref 20–32)
CREATININE: 1.34 mg/dL — AB (ref 0.70–1.11)
Chloride: 97 mmol/L — ABNORMAL LOW (ref 98–110)
GFR, Est African American: 51 mL/min/{1.73_m2} — ABNORMAL LOW (ref >=60)
GFR, Est Non African American: 44 mL/min/{1.73_m2} — ABNORMAL LOW (ref >=60)
Glucose, Bld: 92 mg/dL (ref 65–139)
Potassium: 4.5 mmol/L (ref 3.5–5.3)
Sodium: 129 mmol/L — ABNORMAL LOW (ref 135–146)

## 2018-08-14 NOTE — Progress Notes (Signed)
Kidney function has improved. Sodium has started to decline again.  1st. Are you taking lasix(furesomide) if so SToP.  2nd all this started after starting eliqus and I am wondering if this is a side effect to medication. If not taking eliqus I want to consider switching to another anti-coagulant.  3rd. Do not restrict salt for the time being.

## 2018-08-15 DIAGNOSIS — E871 Hypo-osmolality and hyponatremia: Secondary | ICD-10-CM | POA: Insufficient documentation

## 2018-08-15 DIAGNOSIS — J432 Centrilobular emphysema: Secondary | ICD-10-CM | POA: Insufficient documentation

## 2018-08-15 NOTE — Progress Notes (Signed)
Please send order to lab for BMP for Wednesday.   He will stop norvasc to see if swelling improves.  StOP all lasix.  STAY on eliquis.  Do not restrict sodium.

## 2018-08-18 ENCOUNTER — Other Ambulatory Visit: Payer: Self-pay | Admitting: Physician Assistant

## 2018-08-18 ENCOUNTER — Telehealth: Payer: Self-pay | Admitting: Physician Assistant

## 2018-08-18 DIAGNOSIS — E875 Hyperkalemia: Secondary | ICD-10-CM

## 2018-08-18 DIAGNOSIS — J3489 Other specified disorders of nose and nasal sinuses: Secondary | ICD-10-CM

## 2018-08-18 DIAGNOSIS — E871 Hypo-osmolality and hyponatremia: Secondary | ICD-10-CM

## 2018-08-18 MED ORDER — IPRATROPIUM BROMIDE 0.06 % NA SOLN: 2.0000 | Freq: Four times a day (QID) | NASAL | 0 refills | Status: DC | PRN

## 2018-08-18 MED ORDER — FLUTICASONE FUROATE-VILANTEROL 100-25 MCG/INH IN AEPB: 1.0000 | INHALATION_SPRAY | Freq: Every day | RESPIRATORY_TRACT | 0 refills | Status: DC

## 2018-08-18 NOTE — Telephone Encounter (Signed)
Pt's daughter advised.  

## 2018-08-18 NOTE — Telephone Encounter (Signed)
-----   Message from Delrae Alfred, [place] sent at 08/15/2018  2:30 PM EDT ----- Daughter notified.  She stated that her dad still has a productive cough and runny nose. Please advise. -EH/RMA

## 2018-08-18 NOTE — Progress Notes (Signed)
Lab ordered per last result note.

## 2018-08-18 NOTE — Telephone Encounter (Signed)
There is no evidence of pneumonia on his chest imaging, so the productive cough is likely related to his emphsyema/COPD, which was seen on the chest CT.  I've sent in Breo inhaler for the cough/COPD and nasal spray for the runny nose to local pharmacy He may continue Mucinex Follow-up with PCP this week if symptoms worsen or do not improve. He may need to be evaluated for a sinus infection at this point.

## 2018-08-20 ENCOUNTER — Encounter: Payer: Self-pay | Admitting: Physician Assistant

## 2018-08-20 ENCOUNTER — Ambulatory Visit (INDEPENDENT_AMBULATORY_CARE_PROVIDER_SITE_OTHER): Payer: Medicare Other | Admitting: Physician Assistant

## 2018-08-20 VITALS — BP 162/74 | HR 72 | Ht 66.0 in | Wt 133.0 lb

## 2018-08-20 DIAGNOSIS — R7989 Other specified abnormal findings of blood chemistry: Secondary | ICD-10-CM | POA: Diagnosis not present

## 2018-08-20 DIAGNOSIS — I10 Essential (primary) hypertension: Secondary | ICD-10-CM

## 2018-08-20 DIAGNOSIS — E871 Hypo-osmolality and hyponatremia: Secondary | ICD-10-CM

## 2018-08-20 DIAGNOSIS — I959 Hypotension, unspecified: Secondary | ICD-10-CM

## 2018-08-20 NOTE — Progress Notes (Signed)
s  Subjective:    Patient ID: Mark Mora, male    DOB: 1920-01-20, 84 y.o.   MRN: [id number]  HPI  Pt is a 84 yo male with PMH of CVA, HTN, PE, Emphysema, GERD, prostate cancer who presents to the clinic to follow up on hyponatremia and variable blood pressure.   After reviewing the last 3-4 months it has been noted that his sodium is always low when he returns to ED with confusion/blood pressures/syncope issues. This has never been a problem until after the CVA and PE. We recently stopped lasix to see if it was that. He is here to recheck labs today.   He does report that cough is completely gone and not using inhaler.   .. Active Ambulatory Problems    Diagnosis Date Noted  . CKD (chronic kidney disease) stage 3, GFR 30-59 ml/min (HCC) 11/05/2017  . 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 Essential hypertension 11/05/2017  . Carotid atherosclerosis, right 11/05/2017  . Pure hypercholesterolemia 11/05/2017  . Screening for diabetes mellitus 11/05/2017  . Hearing loss due to cerumen impaction, bilateral 11/05/2017  . Ingrown toenail of left foot 03/20/2018  . Toenail fungus 03/20/2018  . Hearing loss of left ear due to cerumen impaction 03/20/2018  . Gastroesophageal reflux disease with esophagitis 03/23/2018  . Benign prostatic hyperplasia with urinary frequency 03/23/2018  . Neuropathy 03/23/2018  . History of prostate cancer 05/05/2018  . Numbness and tingling of both feet 05/05/2018  . Prostate cancer ([place]) 05/23/2018  . Weight loss 05/23/2018  . Spinal stenosis of lumbar region 05/23/2018  . Pulmonary embolus ([place]) 07/15/2018  . Cerebrovascular accident (CVA) due to thrombosis of precerebral artery ([place]) 07/15/2018  . Paresthesias 07/16/2018  . Hypotension 07/16/2018  . Slow transit constipation 07/26/2018  . Bilateral upper abdominal discomfort 07/26/2018  . Burping 07/26/2018  . Opacity of lung on imaging study 07/26/2018  . Emphysema lung  ([place]) 08/15/2018  . Hyponatremia 08/15/2018   Resolved Ambulatory Problems    Diagnosis Date Noted  . No Resolved Ambulatory Problems   Past Medical History:  Diagnosis Date  . Hyperlipidemia   . Hypertension        Review of Systems  All other systems reviewed and are negative.      Objective:   Physical Exam  Constitutional: He is oriented to person, place, and time. He appears well-developed and well-nourished.  HENT:  Head: Normocephalic and atraumatic.  Cardiovascular: Normal rate and regular rhythm.  Pulmonary/Chest: Effort normal and breath sounds normal.  Neurological: He is alert and oriented to person, place, and time.  Skin:  1+ pitting edema bilateral ankles.   Psychiatric: He has a normal mood and affect. His behavior is normal.          Assessment & Plan:  Marland KitchenMarland KitchenDiagnoses and all orders for this visit:  Hyponatremia -     BASIC METABOLIC PANEL WITH GFR -     Osmolality -     Cancel: Osmolality, urine -     Cortisol, free, Serum -     Cancel: Sodium, urine, random -     TSH -     Osmolality, urine -     Sodium, urine, random  Essential hypertension  Hypotension, unspecified hypotension type  Other orders -     sodium chloride 1 g tablet; Take 1 tablet (1 g total) by mouth once for 1 dose.   Unclear what is causing hyponateremia since it is a fairly new  dx.  Pt does have elevated PSA without prostate so thought is he had cancers somewhere else in his body. PET scan done 6/13 no abnormality.  Thought it could be some of the medication he recently started eliquis, lasix, norvasc. Stop lasix for now and see how labs respond. Discussed not to worry unless BP gets above 170/100. ofcourse we are trying to keep a close balance between monitoring BP and keeping sodium in range.   Labs to further evaluate hyponatremia ordered today.   .. Results for orders placed or performed in visit on 93/23/55  BASIC METABOLIC PANEL WITH GFR  Result Value Ref  Range   Glucose, Bld 94 65 - 99 mg/dL   BUN 22 7 - 25 mg/dL   Creat 1.21 (H) 0.70 - 1.11 mg/dL   GFR, Est Non African American 50 (L) > OR = 60 mL/min/1.32m2   GFR, Est African American 58 (L) > OR = 60 mL/min/1.71m2   BUN/Creatinine Ratio 18 6 - 22 (calc)   Sodium 127 (L) 135 - 146 mmol/L   Potassium 5.1 3.5 - 5.3 mmol/L   Chloride 95 (L) 98 - 110 mmol/L   CO2 29 20 - 32 mmol/L   Calcium 9.2 8.6 - 10.3 mg/dL  Osmolality  Result Value Ref Range   Osmolality 275 (L) 278 - 305 mOsm/kg  Cortisol, free, Serum  Result Value Ref Range   Cortisol Free, Ser 1.16 (H) mcg/dL  TSH  Result Value Ref Range   TSH 1.42 0.40 - 4.50 mIU/L  Osmolality, urine  Result Value Ref Range   Osmolality, Ur 307 50 - 1,200 mOsm/kg  Sodium, urine, random  Result Value Ref Range   Sodium, Ur 64 28 - 272 mmol/L   Sodium continues to drop and not respond to the elimination of lasix. Overall ok with current leg swelling can be controlled with elevation and light compression.  Discussed with Dr. Madilyn Fireman and decided to add 1g of salt daily and recheck labs on Wednesday. I do worry a little about BP. His daughter will monitor. No abnormality in sodium in urine. If not responding my consider endocrine consult or switching from eliquis to coumadin and although hyponateremia not a listed side effect this all started with the June July CVA/PE episode.   Marland Kitchen.Spent 30 minutes with patient and greater than 50 percent of visit spent counseling patient regarding treatment plan.

## 2018-08-20 NOTE — Progress Notes (Signed)
Sodium has not improved and infact worsened just a bit.   Do not limit salt and infact ok to actually eat some.   I am waiting for other labs to return to let you know next step. I am also going to discuss with other providers to get their opinion.

## 2018-08-20 NOTE — Patient Instructions (Signed)
GET labs.   If BP increases above 170/100 call office.   Keep medications same. NO Lasix(furesomide).

## 2018-08-22 ENCOUNTER — Other Ambulatory Visit: Payer: Self-pay

## 2018-08-22 DIAGNOSIS — E871 Hypo-osmolality and hyponatremia: Secondary | ICD-10-CM

## 2018-08-22 LAB — OSMOLALITY, URINE: Osmolality, Ur: 307 mOsm/kg (ref 50–1200)

## 2018-08-22 LAB — SODIUM, URINE, RANDOM: Sodium, Ur: 64 mmol/L (ref 28–272)

## 2018-08-22 MED ORDER — SODIUM CHLORIDE 1 G PO TABS: 1.0000 g | ORAL_TABLET | Freq: Once | ORAL | 0 refills | Status: AC

## 2018-08-22 NOTE — Progress Notes (Signed)
So far all the urine test have been NORMAL which is great news. I have discussed with other providers and believe the next best step is to start one salt tablet a day. Recheck level next week Wednesday or after. I will send to pharmacy.

## 2018-08-24 LAB — BASIC METABOLIC PANEL WITH GFR
BUN/Creatinine Ratio: 18 (calc) (ref 6–22)
BUN: 22 mg/dL (ref 7–25)
CHLORIDE: 95 mmol/L — AB (ref 98–110)
CO2: 29 mmol/L (ref 20–32)
Calcium: 9.2 mg/dL (ref 8.6–10.3)
Creat: 1.21 mg/dL — ABNORMAL HIGH (ref 0.70–1.11)
GFR, Est African American: 58 mL/min/{1.73_m2} — ABNORMAL LOW (ref >=60)
GFR, Est Non African American: 50 mL/min/{1.73_m2} — ABNORMAL LOW (ref >=60)
Glucose, Bld: 94 mg/dL (ref 65–99)
POTASSIUM: 5.1 mmol/L (ref 3.5–5.3)
SODIUM: 127 mmol/L — AB (ref 135–146)

## 2018-08-24 LAB — TSH: TSH: 1.42 mIU/L (ref 0.40–4.50)

## 2018-08-24 LAB — OSMOLALITY: OSMOLALITY: 275 mosm/kg — AB (ref 278–305)

## 2018-08-24 LAB — CORTISOL, FREE: CORTISOL FREE SERUM: 1.16 ug/dL — AB

## 2018-08-25 ENCOUNTER — Encounter: Payer: Self-pay | Admitting: Physician Assistant

## 2018-08-25 NOTE — Addendum Note (Signed)
Addended by: Donella Stade on: 08/25/2018 08:07 AM   Modules accepted: Orders

## 2018-08-25 NOTE — Progress Notes (Signed)
More labs received. Cortisol elevated. I would like for you to see endocrinology for more testing. Cortisol is a hormone secreted from your adrenal glands which can cause elevated blood pressure. It is considered a "stress hormone too". I will make referral.

## 2018-08-26 ENCOUNTER — Telehealth: Payer: Self-pay

## 2018-08-26 ENCOUNTER — Other Ambulatory Visit: Payer: Self-pay | Admitting: Physician Assistant

## 2018-08-26 NOTE — Telephone Encounter (Signed)
Ok have you looked into good rx for this? Ok to wait.

## 2018-08-26 NOTE — Telephone Encounter (Signed)
Spoke to pt's daughter, she states RX for sodium is going to cost her $45.   Pt has increased sodium in his diet since his last appt with Luvenia Starch so they are wondering if pt really needs this RX?  Repeat labs for pt are scheduled for 08-28-18. Should pt come have labs rechecked on Thursday and see if sodium has improved then pick up medicine if sodium is still low?  Please advise

## 2018-08-26 NOTE — Telephone Encounter (Signed)
Left recommendations on Cynthias VM per her request

## 2018-08-29 LAB — BASIC METABOLIC PANEL
BUN / CREAT RATIO: 18 (calc) (ref 6–22)
BUN: 24 mg/dL (ref 7–25)
CHLORIDE: 100 mmol/L (ref 98–110)
CO2: 22 mmol/L (ref 20–32)
CREATININE: 1.33 mg/dL — AB (ref 0.70–1.11)
Calcium: 9 mg/dL (ref 8.6–10.3)
Glucose, Bld: 101 mg/dL — ABNORMAL HIGH (ref 65–99)
Potassium: 4.6 mmol/L (ref 3.5–5.3)
SODIUM: 133 mmol/L — AB (ref 135–146)

## 2018-08-29 NOTE — Progress Notes (Signed)
Call pt: much better! If we can keep sodium above 130's then you should stay asymptomatic from low sodium. How do you feel?

## 2018-09-04 ENCOUNTER — Telehealth: Payer: Self-pay

## 2018-09-04 NOTE — Telephone Encounter (Signed)
Carilyn Goodpasture. A man named Cristie Hem called today and he is the person who does the home health visits with [employer]. He said he is in need of a verbal order from you so he can continue to help Finn[place] in his home. I have never received something like this, so I just thought I would send it to you first to see what you wanted me to do. Thanks so much!

## 2018-09-06 NOTE — Telephone Encounter (Signed)
Yes verbal ok for any home health needs.

## 2018-09-08 ENCOUNTER — Ambulatory Visit (INDEPENDENT_AMBULATORY_CARE_PROVIDER_SITE_OTHER): Payer: Medicare Other | Admitting: Physician Assistant

## 2018-09-08 ENCOUNTER — Encounter: Payer: Self-pay | Admitting: Physician Assistant

## 2018-09-08 VITALS — BP 121/54 | HR 82 | Ht 66.0 in | Wt 132.0 lb

## 2018-09-08 DIAGNOSIS — B351 Tinea unguium: Secondary | ICD-10-CM

## 2018-09-08 DIAGNOSIS — E871 Hypo-osmolality and hyponatremia: Secondary | ICD-10-CM

## 2018-09-08 DIAGNOSIS — R600 Localized edema: Secondary | ICD-10-CM

## 2018-09-08 DIAGNOSIS — L603 Nail dystrophy: Secondary | ICD-10-CM | POA: Diagnosis not present

## 2018-09-08 DIAGNOSIS — I872 Venous insufficiency (chronic) (peripheral): Secondary | ICD-10-CM | POA: Diagnosis not present

## 2018-09-08 NOTE — Patient Instructions (Addendum)
[employer] compress stocking.  Will refer to podiatry. Toenail and potential removal.    Chronic Venous Insufficiency Chronic venous insufficiency, also called venous stasis, is a condition that prevents blood from being pumped effectively through the veins in your legs. Blood may no longer be pumped effectively from the legs back to the heart. This condition can range from mild to severe. With proper treatment, you should be able to continue with an active life. What are the causes? Chronic venous insufficiency occurs when the vein walls become stretched, weakened, or damaged, or when valves within the vein are damaged. Some common causes of this include:  High blood pressure inside the veins (venous hypertension).  Increased blood pressure in the leg veins from long periods of sitting or standing.  A blood clot that blocks blood flow in a vein (deep vein thrombosis, DVT).  Inflammation of a vein (phlebitis) that causes a blood clot to form.  Tumors in the pelvis that cause blood to back up.  What increases the risk? The following factors may make you more likely to develop this condition:  Having a family history of this condition.  Obesity.  Pregnancy.  Living without enough physical activity or exercise (sedentary lifestyle).  Smoking.  Having a job that requires long periods of standing or sitting in one place.  Being a certain age. Women in their 22s and 31s and men in their 68s are more likely to develop this condition.  What are the signs or symptoms? Symptoms of this condition include:  Veins that are enlarged, bulging, or twisted (varicose veins).  Skin breakdown or ulcers.  Reddened or discolored skin on the front of the leg.  Brown, smooth, tight, and painful skin just above the ankle, usually on the inside of the leg (lipodermatosclerosis).  Swelling.  How is this diagnosed? This condition may be diagnosed based on:  Your medical history.  A  physical exam.  Tests, such as: ? A procedure that creates an image of a blood vessel and nearby organs and provides information about blood flow through the blood vessel (duplex ultrasound). ? A procedure that tests blood flow (plethysmography). ? A procedure to look at the veins using X-ray and dye (venogram).  How is this treated? The goals of treatment are to help you return to an active life and to minimize pain or disability. Treatment depends on the severity of your condition, and it may include:  Wearing compression stockings. These can help relieve symptoms and help prevent your condition from getting worse. However, they do not cure the condition.  Sclerotherapy. This is a procedure involving an injection of a material that "dissolves" damaged veins.  Surgery. This may involve: ? Removing a diseased vein (vein stripping). ? Cutting off blood flow through the vein (laser ablation surgery). ? Repairing a valve.  Follow these instructions at home:  Wear compression stockings as told by your health care provider. These stockings help to prevent blood clots and reduce swelling in your legs.  Take over-the-counter and prescription medicines only as told by your health care provider.  Stay active by exercising, walking, or doing different activities. Ask your health care provider what activities are safe for you and how much exercise you need.  Drink enough fluid to keep your urine clear or pale yellow.  Do not use any products that contain nicotine or tobacco, such as cigarettes and e-cigarettes. If you need help quitting, ask your health care provider.  Keep all follow-up visits as told  by your health care provider. This is important. Contact a health care provider if:  You have redness, swelling, or more pain in the affected area.  You see a red streak or line that extends up or down from the affected area.  You have skin breakdown or a loss of skin in the affected area,  even if the breakdown is small.  You get an injury in the affected area. Get help right away if:  You get an injury and an open wound in the affected area.  You have severe pain that does not get better with medicine.  You have sudden numbness or weakness in the foot or ankle below the affected area, or you have trouble moving your foot or ankle.  You have a fever and you have worse or persistent symptoms.  You have chest pain.  You have shortness of breath. Summary  Chronic venous insufficiency, also called venous stasis, is a condition that prevents blood from being pumped effectively through the veins in your legs.  Chronic venous insufficiency occurs when the vein walls become stretched, weakened, or damaged, or when valves within the vein are damaged.  Treatment for this condition depends on how severe your condition is, and it may involve wearing compression stockings or having a procedure.  Make sure you stay active by exercising, walking, or doing different activities. Ask your health care provider what activities are safe for you and how much exercise you need. This information is not intended to replace advice given to you by your health care provider. Make sure you discuss any questions you have with your health care provider. Document Released: 04/01/2007 Document Revised: 10/15/2016 Document Reviewed: 10/15/2016 Elsevier Interactive Patient Education  2017 [employer].

## 2018-09-08 NOTE — Progress Notes (Signed)
Subjective:    Patient ID: Mark Mora, male    DOB: 10-03-1920, 84 y.o.   MRN: [id number]  HPI Pt is a 84 yo male with HTN, hx of PE, emphysema, recent CVA, HLD, and hyponatremia who presents to the clinic for follow up.   Overall  He is doing and feeling much better. Last sodium level was above 130 and he notices a difference in how he feels. He continues to push salt. BP great today.   He is not taking lasix and wants to know what to do for feet and ankle swelling. He is not wearing compression stockings. Better in the morning worse during the day and at night.   He also has toenails that are thick and need cutting. He wonders where he can go.   .. Active Ambulatory Problems    Diagnosis Date Noted  . CKD (chronic kidney disease) stage 3, GFR 30-59 ml/min (HCC) 11/05/2017  . 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 Essential hypertension 11/05/2017  . Carotid atherosclerosis, right 11/05/2017  . Pure hypercholesterolemia 11/05/2017  . Screening for diabetes mellitus 11/05/2017  . Hearing loss due to cerumen impaction, bilateral 11/05/2017  . Ingrown toenail of left foot 03/20/2018  . Toenail fungus 03/20/2018  . Hearing loss of left ear due to cerumen impaction 03/20/2018  . Gastroesophageal reflux disease with esophagitis 03/23/2018  . Benign prostatic hyperplasia with urinary frequency 03/23/2018  . Neuropathy 03/23/2018  . History of prostate cancer 05/05/2018  . Numbness and tingling of both feet 05/05/2018  . Prostate cancer ([place]) 05/23/2018  . Weight loss 05/23/2018  . Spinal stenosis of lumbar region 05/23/2018  . Pulmonary embolus ([place]) 07/15/2018  . Cerebrovascular accident (CVA) due to thrombosis of precerebral artery ([place]) 07/15/2018  . Paresthesias 07/16/2018  . Hypotension 07/16/2018  . Slow transit constipation 07/26/2018  . Bilateral upper abdominal discomfort 07/26/2018  . Burping 07/26/2018  . Opacity of lung on imaging study  07/26/2018  . Emphysema lung ([place]) 08/15/2018  . Hyponatremia 08/15/2018  . Dystrophic nail 09/10/2018  . Bilateral edema of lower extremity 09/10/2018  . Chronic venous stasis dermatitis of both lower extremities 09/10/2018   Resolved Ambulatory Problems    Diagnosis Date Noted  . No Resolved Ambulatory Problems   Past Medical History:  Diagnosis Date  . Hyperlipidemia   . Hypertension       Review of Systems See HPI.     Objective:   Physical Exam  Constitutional: He is oriented to person, place, and time. He appears well-developed and well-nourished.  In a wheelchair.   HENT:  Head: Normocephalic and atraumatic.  Cardiovascular: Normal rate and regular rhythm.  Pulmonary/Chest: Effort normal and breath sounds normal.  Neurological: He is alert and oriented to person, place, and time.  Skin:  Right great toe thick and yellow with dystrophic appearance with apparent fungus. Multiple toenails with dystrophic appearance.   1+ pitting edema of bilateral feet and ankles.   Psychiatric: He has a normal mood and affect. His behavior is normal.          Assessment & Plan:  Marland KitchenMarland KitchenDiagnoses and all orders for this visit:  Chronic venous stasis dermatitis of both lower extremities  Toenail fungus -     Ambulatory referral to Podiatry  Hyponatremia  Dystrophic nail -     Ambulatory referral to Podiatry  Bilateral edema of lower extremity   BP looks great! Keep everything the same.  Continue to push salt.  Recheck BMP in 2 months.   Discussed chronic venous insuffiencey. Can use lasix as needed just avoid every day use. Needs compression stockings. Pt's daughter given address to elastic company to get affordable easy to use stockings. Keep feet elevated.   Referral made for podiatry. Right great toenail perhaps just need to be removed if causing trouble and overwhelming present with fungus.   Marland Kitchen.Spent 30 minutes with patient and greater than 50 percent of visit spent  counseling patient regarding treatment plan.

## 2018-09-10 DIAGNOSIS — R600 Localized edema: Secondary | ICD-10-CM | POA: Insufficient documentation

## 2018-09-10 DIAGNOSIS — L603 Nail dystrophy: Secondary | ICD-10-CM | POA: Insufficient documentation

## 2018-09-10 DIAGNOSIS — I872 Venous insufficiency (chronic) (peripheral): Secondary | ICD-10-CM | POA: Insufficient documentation

## 2018-09-16 ENCOUNTER — Other Ambulatory Visit: Payer: Self-pay

## 2018-09-16 DIAGNOSIS — I2699 Other pulmonary embolism without acute cor pulmonale: Secondary | ICD-10-CM

## 2018-09-16 DIAGNOSIS — I6300 Cerebral infarction due to thrombosis of unspecified precerebral artery: Secondary | ICD-10-CM

## 2018-09-16 MED ORDER — APIXABAN 5 MG PO TABS: 5.0000 mg | ORAL_TABLET | Freq: Two times a day (BID) | ORAL | 0 refills | Status: DC

## 2018-09-16 NOTE — Addendum Note (Signed)
Addended by: Narda Rutherford on: 09/16/2018 11:35 AM   Modules accepted: Orders

## 2018-09-16 NOTE — Telephone Encounter (Signed)
Refill, historical provider.

## 2018-09-26 ENCOUNTER — Ambulatory Visit: Payer: Medicare Other | Admitting: Podiatry

## 2018-09-26 ENCOUNTER — Other Ambulatory Visit: Payer: Self-pay

## 2018-09-26 DIAGNOSIS — B351 Tinea unguium: Secondary | ICD-10-CM

## 2018-09-26 DIAGNOSIS — N400 Enlarged prostate without lower urinary tract symptoms: Secondary | ICD-10-CM | POA: Insufficient documentation

## 2018-09-26 DIAGNOSIS — D689 Coagulation defect, unspecified: Secondary | ICD-10-CM | POA: Diagnosis not present

## 2018-09-26 DIAGNOSIS — E785 Hyperlipidemia, unspecified: Secondary | ICD-10-CM | POA: Insufficient documentation

## 2018-09-26 DIAGNOSIS — R600 Localized edema: Secondary | ICD-10-CM | POA: Diagnosis not present

## 2018-09-26 DIAGNOSIS — I872 Venous insufficiency (chronic) (peripheral): Secondary | ICD-10-CM | POA: Diagnosis not present

## 2018-09-26 DIAGNOSIS — K219 Gastro-esophageal reflux disease without esophagitis: Secondary | ICD-10-CM | POA: Insufficient documentation

## 2018-09-26 DIAGNOSIS — M47812 Spondylosis without myelopathy or radiculopathy, cervical region: Secondary | ICD-10-CM | POA: Insufficient documentation

## 2018-09-26 MED ORDER — CICLOPIROX 8 % EX SOLN: Freq: Every day | CUTANEOUS | 0 refills | Status: DC

## 2018-10-09 ENCOUNTER — Ambulatory Visit (INDEPENDENT_AMBULATORY_CARE_PROVIDER_SITE_OTHER): Payer: Medicare Other | Admitting: Family Medicine

## 2018-10-09 DIAGNOSIS — Z23 Encounter for immunization: Secondary | ICD-10-CM

## 2018-11-10 ENCOUNTER — Ambulatory Visit (INDEPENDENT_AMBULATORY_CARE_PROVIDER_SITE_OTHER): Payer: Medicare Other | Admitting: *Deleted

## 2018-11-10 VITALS — BP 144/64 | HR 76 | Ht 66.0 in | Wt 130.0 lb

## 2018-11-10 DIAGNOSIS — Z0000 Encounter for general adult medical examination without abnormal findings: Secondary | ICD-10-CM

## 2018-11-10 NOTE — Patient Instructions (Signed)
Mark Mora , Thank you for taking time to come for your Medicare Wellness Visit. I appreciate your ongoing commitment to your health goals. Please review the following plan we discussed and let me know if I can assist you in the future.   Please schedule your next medicare wellness visit with me in 1 yr.  Continue doing brain stimulating activities (puzzles, reading, adult coloring books, staying active) to keep memory sharp.

## 2018-11-10 NOTE — Progress Notes (Signed)
Subjective:   Cedric Mcclaine is a 84 y.o. male who presents for an Initial Medicare Annual Wellness Visit.  Review of Systems  No ROS.  Medicare Wellness Visit. Additional risk factors are reflected in the social history.  Cardiac Risk Factors include: dyslipidemia;advanced age (>17men, >57 women);male gender;sedentary lifestyle  Sleep patterns: getting between 5 or 6 hours a night. Wakes up 3 times a night to go to bathroom. Home Safety/Smoke Alarms: Feels safe in home. Smoke alarms in place.  Living environment; Lives with daughter in 1 story home. Stairs have hand rails. Shower is a step over tub no grab rails in place.     Male:   CCS- aged out    PSA- utd Lab Results  Component Value Date   PSA 30.8 (H) 08/01/2018   PSA 36.7 (H) 03/20/2018       Objective:    Today's Vitals   11/10/18 1455 11/10/18 1500  BP: (!) 144/64   Pulse: 76   SpO2: 99%   Weight: 130 lb (59 kg)   Height: 5\' 6"  (1.676 m)   PainSc:  5    Body mass index is 20.98 kg/m.  Advanced Directives 11/10/2018  Does Patient Have a Medical Advance Directive? Yes  Type of Paramedic of [place];Living will  Does patient want to make changes to medical advance directive? No - Patient declined  Copy of [place] in Chart? Yes - validated most recent copy scanned in chart (See row information)    Current Medications (verified) Outpatient Encounter Medications as of 11/10/2018  Medication Sig  . acetaminophen (TYLENOL) 650 MG CR tablet Take by mouth.  Marland Kitchen amLODipine (NORVASC) 2.5 MG tablet Take 1 tablet (2.5 mg total) by mouth daily.  Marland Kitchen apixaban (ELIQUIS) 5 MG TABS tablet Take 1 tablet (5 mg total) by mouth 2 (two) times daily.  Marland Kitchen atorvastatin (LIPITOR) 40 MG tablet TAKE 1 TABLET BY MOUTH  DAILY  . Cholecalciferol (VITAMIN D3) 2000 units TABS Take by mouth.  . fluticasone (FLONASE) 50 MCG/ACT nasal spray 1 spray by Both Nostrils route daily.  Marland Kitchen lisinopril  (PRINIVIL,ZESTRIL) 20 MG tablet TAKE 1 TABLET BY MOUTH  DAILY  . tamsulosin (FLOMAX) 0.4 MG CAPS capsule Take 1 capsule (0.4 mg total) by mouth daily.  . ciclopirox (PENLAC) 8 % solution Apply topically at bedtime. Apply over nail and surrounding skin. Apply daily over previous coat. Remove weekly with file or polish remover. (Patient not taking: Reported on 11/10/2018)  . cloNIDine (CATAPRES) 0.1 MG tablet TAKE 1 TABLET BY MOUTH EVERY 6 HOURS AS NEEDED (SBP 170 OR DBP 100) FOR UP TO 30 DAYS.  . cloNIDine (CATAPRES) 0.1 MG tablet Take by mouth.  . clotrimazole (GYNE-LOTRIMIN 3) 2 % vaginal cream 1 application twice a day for 7 days. (Patient not taking: Reported on 11/10/2018)  . furosemide (LASIX) 20 MG tablet Take by mouth.  Marland Kitchen ipratropium (ATROVENT) 0.06 % nasal spray Place 2 sprays into both nostrils 4 (four) times daily as needed for rhinitis. (Patient not taking: Reported on 11/10/2018)  . traMADol (ULTRAM) 50 MG tablet Take one tablet twice a day. (Patient not taking: Reported on 11/10/2018)  . [DISCONTINUED] azithromycin (ZITHROMAX) 250 MG tablet   . [DISCONTINUED] fluconazole (DIFLUCAN) 150 MG tablet TAKE ONE TABLET BY MOUTH AS A ONE TIME DOSE REPEAT IN 48 72 HOURS IF SYMPTOMS PERSIST  . [DISCONTINUED] nystatin cream (MYCOSTATIN) Apply topically.  . [DISCONTINUED] omeprazole (PRILOSEC) 40 MG capsule Take 1 capsule (40  mg total) by mouth daily.   No facility-administered encounter medications on file as of 11/10/2018.     Allergies (verified) Patient has no known allergies.   History: Past Medical History:  Diagnosis Date  . Carotid atherosclerosis, right 11/05/2017  . CKD (chronic kidney disease) stage 3, GFR 30-59 ml/min (HCC) 11/05/2017  . Essential hypertension 11/05/2017  . Hearing loss due to cerumen impaction, bilateral 11/05/2017  . Hyperlipidemia   . Hypertension   . Pure hypercholesterolemia 11/05/2017  . Screening for diabetes mellitus 11/05/2017  . Stroke ([place])   .  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 HERNIA REPAIR     History reviewed. No pertinent family history. Social History   Socioeconomic History  . Marital status: Widowed    Spouse name: Not on file  . Number of children: 2  . Years of education: 7  . Highest education level: 11th grade  Occupational History  . Occupation: retired    Comment: [employer]  Social Needs  . Financial resource strain: Not hard at all  . Food insecurity:    Worry: Never true    Inability: Never true  . Transportation needs:    Medical: No    Non-medical: Not on file  Tobacco Use  . Smoking status: Never Smoker  . Smokeless tobacco: Never Used  Substance and Sexual Activity  . Alcohol use: No    Frequency: Never  . Drug use: No  . Sexual activity: Never  Lifestyle  . Physical activity:    Days per week: 0 days    Minutes per session: 0 min  . Stress: Not at all  Relationships  . Social connections:    Talks on phone: More than three times a week    Gets together: Twice a week    Attends religious service: Never    Active member of club or organization: Yes    Attends meetings of clubs or organizations: 1 to 4 times per year    Relationship status: Widowed  Other Topics Concern  . Not on file  Social History Narrative   Reads daily on his IPAD. Coffee daily.    Tobacco Counseling Counseling given: Not Answered   Clinical Intake:  Pre-visit preparation completed: Yes  Pain : 0-10 Pain Score: 5  Pain Type: Chronic pain Pain Location: Back Pain Orientation: Lower Pain Descriptors / Indicators: Aching, Dull, Pressure Pain Onset: More than a month ago Pain Frequency: Constant Pain Relieving Factors: Tylenol helps some Effect of Pain on Daily Activities: effects it daily. Has spinal stenosis  Pain Relieving Factors: Tylenol helps some  Nutritional Risks: None Diabetes: No  How often do you need to  have someone help you when you read instructions, pamphlets, or other written materials from your doctor or pharmacy?: 1 - Never What is the last grade level you completed in school?: 11  Interpreter Needed?: No  Information entered by :: Orlie Dakin, LPN  Activities of Daily Living In your present state of health, do you have any difficulty performing the following activities: 11/10/2018  Hearing? Y  Comment some loss of hearing. Thinksnit cerumen impaction.  Vision? N  Difficulty concentrating or making decisions? N  Walking or climbing stairs? Y  Dressing or bathing? N  Doing errands, shopping? N  Preparing Food and eating ? N  Using the Toilet? N  In the past six months, have you accidently leaked urine? Y  Comment leaks  urine daily and doesn't know it.  Do you have problems with loss of bowel control? N  Managing your Medications? N  Managing your Finances? N  Housekeeping or managing your Housekeeping? N  Some recent data might be hidden     Immunizations and Health Maintenance Immunization History  Administered Date(s) Administered  . Influenza,inj,Quad PF,6+ Mos 10/09/2018   Health Maintenance Due  Topic Date Due  . PNA vac Low Risk Adult (2 of 2 - PPSV23) 12/11/1999    Patient Care Team: Lavada Mesi as PCP - General (Family Medicine)  Indicate any recent Medical Services you may have received from other than [facility] providers in the past year (date may be approximate).    Assessment:   This is a routine wellness examination for [place].Physical assessment deferred to PCP.   Hearing/Vision screen  Visual Acuity Screening   Right eye Left eye Both eyes  Without correction:     With correction: 20/40 20/40 20/25   Hearing Screening Comments: Whisper test done and patient could not repeat words back. Could hear whisper but not make out the words.  Dietary issues and exercise activities discussed: Current Exercise Habits: The patient does not participate  in regular exercise at present, Exercise limited by: orthopedic condition(s);cardiac condition(s) Diet eats a healthy diet of fruits and vegetables. Breakfast:Bran flakes, banana juice and coffee. Oatmeal as well. Lunch: peanut butter and jelly sandwich.  Dinner:Meat and vegetable      Goals   None    Depression Screen PHQ 2/9 Scores 11/10/2018 11/05/2017 11/05/2017  PHQ - 2 Score 0 0 0  PHQ- 9 Score - 1 -    Fall Risk Fall Risk  11/10/2018  Falls in the past year? 1  Number falls in past yr: 1  Injury with Fall? 0  Risk for fall due to : Impaired balance/gait;Impaired mobility;History of fall(s)  Follow up Falls prevention discussed    Is the patient's home free of loose throw rugs in walkways, pet beds, electrical cords, etc?   yes      Grab bars in the bathroom? yes      Handrails on the stairs?   yes      Adequate lighting?   yes   Cognitive Function:     6CIT Screen 11/10/2018  What Year? 0 points  What month? 0 points  What time? 0 points  Count back from 20 0 points  Months in reverse 0 points  Repeat phrase 0 points  Total Score 0    Screening Tests Health Maintenance  Topic Date Due  . PNA vac Low Risk Adult (2 of 2 - PPSV23) 12/11/1999  . TETANUS/TDAP  09/11/2019 (Originally 07/02/1939)  . INFLUENZA VACCINE  Completed       Plan:      Mr. Capshaw , Thank you for taking time to come for your Medicare Wellness Visit. I appreciate your ongoing commitment to your health goals. Please review the following plan we discussed and let me know if I can assist you in the future.   Please schedule your next medicare wellness visit with me in 1 yr.  Continue doing brain stimulating activities (puzzles, reading, adult coloring books, staying active) to keep memory sharp.    These are the goals we discussed: Goals   None     This is a list of the screening recommended for you and due dates:  Health Maintenance  Topic Date Due  . Pneumonia vaccines (2 of  2 - PPSV23)  12/11/1999  . Tetanus Vaccine  09/11/2019*  . Flu Shot  Completed  *Topic was postponed. The date shown is not the original due date.     I have personally reviewed and noted the following in the patient's chart:   . Medical and social history . Use of alcohol, tobacco or illicit drugs  . Current medications and supplements . Functional ability and status . Nutritional status . Physical activity . Advanced directives . List of other physicians . Hospitalizations, surgeries, and ER visits in previous 12 months . Vitals . Screenings to include cognitive, depression, and falls . Referrals and appointments  In addition, I have reviewed and discussed with patient certain preventive protocols, quality metrics, and best practice recommendations. A written personalized care plan for preventive services as well as general preventive health recommendations were provided to patient.     Joanne Chars, LPN   76/01/[id number]

## 2018-11-17 ENCOUNTER — Ambulatory Visit: Payer: Medicare Other | Admitting: Physician Assistant

## 2018-11-17 ENCOUNTER — Encounter: Payer: Self-pay | Admitting: Physician Assistant

## 2018-11-17 ENCOUNTER — Ambulatory Visit (INDEPENDENT_AMBULATORY_CARE_PROVIDER_SITE_OTHER): Payer: Medicare Other | Admitting: Physician Assistant

## 2018-11-17 VITALS — BP 124/66 | HR 100 | Temp 97.9°F | Wt 130.0 lb

## 2018-11-17 DIAGNOSIS — J440 Chronic obstructive pulmonary disease with acute lower respiratory infection: Secondary | ICD-10-CM

## 2018-11-17 DIAGNOSIS — H6123 Impacted cerumen, bilateral: Secondary | ICD-10-CM | POA: Diagnosis not present

## 2018-11-17 DIAGNOSIS — J432 Centrilobular emphysema: Secondary | ICD-10-CM | POA: Diagnosis not present

## 2018-11-17 MED ORDER — PREDNISONE 50 MG PO TABS: ORAL_TABLET | ORAL | 0 refills | Status: DC

## 2018-11-17 MED ORDER — AZITHROMYCIN 250 MG PO TABS: ORAL_TABLET | ORAL | 0 refills | Status: DC

## 2018-11-17 MED ORDER — UMECLIDINIUM-VILANTEROL 62.5-25 MCG/INH IN AEPB: 1.0000 | INHALATION_SPRAY | Freq: Every day | RESPIRATORY_TRACT | 1 refills | Status: DC

## 2018-11-17 NOTE — Progress Notes (Signed)
HPI:                                                                Mark Mora is a 84 y.o. male who presents to [place]: [place] today for cough  Cough  This is a new problem. The current episode started 1 to 4 weeks ago (approx 2 weeks). The problem has been gradually worsening. The cough is productive of purulent sputum (clear initially, transitioning to yellow). Associated symptoms include ear congestion (left>right) and nasal congestion. Pertinent negatives include no chest pain, ear pain, fever, hemoptysis, myalgias, shortness of breath or wheezing. Associated symptoms comments: + ear congestion. Nothing aggravates the symptoms. Risk factors for lung disease include smoking/tobacco exposure. He has tried OTC cough suppressant (Coriciden) for the symptoms. His past medical history is significant for COPD and emphysema.   CT chest 3 months ago showed moderate centrilobular emphysema c/w COPD, mild bibasilar lung scarring and solitary 4 mm RLL pulm nodule He does not currently take any COPD medications  He also reports b/l ear fullness. Has a history of cerumen impactions. Has been using Debrox drops daily.  Past Medical History:  Diagnosis Date  . Carotid atherosclerosis, right 11/05/2017  . CKD (chronic kidney disease) stage 3, GFR 30-59 ml/min (HCC) 11/05/2017  . Essential hypertension 11/05/2017  . Hearing loss due to cerumen impaction, bilateral 11/05/2017  . Hyperlipidemia   . Hypertension   . Pure hypercholesterolemia 11/05/2017  . Screening for diabetes mellitus 11/05/2017  . Stroke ([place])   . 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 HERNIA REPAIR     Social History   Tobacco Use  . Smoking status: Former Smoker    Packs/day: 0.25    Years: 6.00    Pack years: 1.50    Types: Cigarettes  . Smokeless tobacco: Never Used  . Tobacco comment: quit  age 29  Substance Use Topics  . Alcohol use: No    Frequency: Never   family history is not on file.    ROS: negative except as noted in the HPI  Medications: Current Outpatient Medications  Medication Sig Dispense Refill  . acetaminophen (TYLENOL) 650 MG CR tablet Take by mouth.    Marland Kitchen amLODipine (NORVASC) 2.5 MG tablet Take 1 tablet (2.5 mg total) by mouth daily. 30 tablet 1  . apixaban (ELIQUIS) 5 MG TABS tablet Take 1 tablet (5 mg total) by mouth 2 (two) times daily. 180 tablet 0  . atorvastatin (LIPITOR) 40 MG tablet TAKE 1 TABLET BY MOUTH  DAILY 90 tablet 1  . azithromycin (ZITHROMAX Z-PAK) 250 MG tablet Take 2 tablets (500 mg) on  Day 1,  followed by 1 tablet (250 mg) once daily on Days 2 through 5. 6 tablet 0  . Cholecalciferol (VITAMIN D3) 2000 units TABS Take by mouth.    . ciclopirox (PENLAC) 8 % solution Apply topically at bedtime. Apply over nail and surrounding skin. Apply daily over previous coat. Remove weekly with file or polish remover. (Patient not taking: Reported on 11/10/2018) 6.6 mL 0  . cloNIDine (CATAPRES) 0.1 MG tablet TAKE 1 TABLET BY MOUTH EVERY 6 HOURS AS NEEDED (SBP 170 OR DBP 100)  FOR UP TO 30 DAYS.  0  . cloNIDine (CATAPRES) 0.1 MG tablet Take by mouth.    . clotrimazole (GYNE-LOTRIMIN 3) 2 % vaginal cream 1 application twice a day for 7 days. (Patient not taking: Reported on 11/10/2018) 21 g 0  . fluticasone (FLONASE) 50 MCG/ACT nasal spray 1 spray by Both Nostrils route daily.    . furosemide (LASIX) 20 MG tablet Take by mouth.    Marland Kitchen ipratropium (ATROVENT) 0.06 % nasal spray Place 2 sprays into both nostrils 4 (four) times daily as needed for rhinitis. (Patient not taking: Reported on 11/10/2018) 15 mL 0  . lisinopril (PRINIVIL,ZESTRIL) 20 MG tablet TAKE 1 TABLET BY MOUTH  DAILY 90 tablet 1  . predniSONE (DELTASONE) 50 MG tablet One tab PO daily for 5 days. 5 tablet 0  . tamsulosin (FLOMAX) 0.4 MG CAPS capsule Take 1 capsule (0.4 mg total) by mouth daily. 90  capsule 3  . traMADol (ULTRAM) 50 MG tablet Take one tablet twice a day. (Patient not taking: Reported on 11/10/2018) 60 tablet 0  . umeclidinium-vilanterol (ANORO ELLIPTA) 62.5-25 MCG/INH AEPB Inhale 1 puff into the lungs daily. 90 each 1   No current facility-administered medications for this visit.    No Known Allergies     Objective:  BP 124/66   Pulse 100   Temp 97.9 F (36.6 C) (Oral)   Wt 130 lb (59 kg)   SpO2 95%   BMI 20.98 kg/m  Gen:  alert, not ill-appearing, no distress, appropriate for age 88: head normocephalic without obvious abnormality, conjunctiva and cornea clear, tympanic membranes obstructed by cerumen bilaterally, normal external ear canals, no mastoid tenderness, nasal mucosa edematous, no sinus tenderness, oropharynx clear, uvula midline, neck supple, no cervical adenopathy, trachea midline Pulm: Normal work of breathing, normal phonation, clear to auscultation bilaterally, no wheezes, rales or rhonchi CV: Mildly tachycardic rate, regular rhythm, s1 and s2 distinct, no murmurs, clicks or rubs  Neuro: alert and oriented x 3, no tremor MSK: extremities atraumatic, normal gait and station Skin: intact, no rashes on exposed skin, no jaundice, no cyanosis  Procedure: Bilateral ear debridement Indication: Aural fullness, hearing loss due to cerumen impaction Verbal consent obtain Manual debridement with lighted ear curette Patient tolerated the procedure without any complications. Post-debridement examination shows cerumen was completely removed. TM clear without erythema or perforation.    No results found for this or any previous visit (from the past 72 hour(s)). No results found.    Assessment and Plan: 84 y.o. male with   .Mark Mora was seen today for cough.  Diagnoses and all orders for this visit:  Centrilobular emphysema ([place]) -     umeclidinium-vilanterol (ANORO ELLIPTA) 62.5-25 MCG/INH AEPB; Inhale 1 puff into the lungs daily.  COPD with  acute lower respiratory infection ([place]) -     azithromycin (ZITHROMAX Z-PAK) 250 MG tablet; Take 2 tablets (500 mg) on  Day 1,  followed by 1 tablet (250 mg) once daily on Days 2 through 5. -     predniSONE (DELTASONE) 50 MG tablet; One tab PO daily for 5 days.  Bilateral impacted cerumen   Afebrile, no tachypnea, mildly tachycardic at 98 to 100 bpm, pulse ox 95% on room air at rest, no adventitious lung sounds Treating for mild COPD exacerbation with azithromycin and prednisone burst Also starting him on Anoro for centrilobular emphysema Follow-up with PCP in 1 month  Successfully irrigated ears in office today (see procedure note)  Patient education and anticipatory guidance given  Patient agrees with treatment plan Follow-up in 1 month or sooner as needed if symptoms worsen or fail to improve  Darlyne Russian PA-C

## 2018-11-17 NOTE — Patient Instructions (Signed)

## 2018-11-18 ENCOUNTER — Telehealth: Payer: Self-pay | Admitting: Family Medicine

## 2018-11-18 MED ORDER — FAMOTIDINE 20 MG PO TABS: 20.0000 mg | ORAL_TABLET | Freq: Two times a day (BID) | ORAL | 1 refills | Status: DC

## 2018-11-18 NOTE — Telephone Encounter (Signed)
New rx for famotidine sent to mail order.

## 2018-11-18 NOTE — Telephone Encounter (Signed)
Pt is needing a substitute for his ranitidine that has been discontinued. Pt daughter  was advised it had been recalled and he is to stop the medication. She expressed understanding and had no further questions at this time.

## 2018-11-19 NOTE — Telephone Encounter (Signed)
Patient's daughter advised.  

## 2018-11-23 ENCOUNTER — Encounter: Payer: Self-pay | Admitting: Physician Assistant

## 2018-11-23 DIAGNOSIS — H6123 Impacted cerumen, bilateral: Secondary | ICD-10-CM | POA: Insufficient documentation

## 2018-11-28 ENCOUNTER — Other Ambulatory Visit: Payer: Self-pay | Admitting: Physician Assistant

## 2018-11-28 DIAGNOSIS — I6300 Cerebral infarction due to thrombosis of unspecified precerebral artery: Secondary | ICD-10-CM

## 2018-11-28 DIAGNOSIS — I6521 Occlusion and stenosis of right carotid artery: Secondary | ICD-10-CM

## 2018-11-28 DIAGNOSIS — I2699 Other pulmonary embolism without acute cor pulmonale: Secondary | ICD-10-CM

## 2018-11-28 DIAGNOSIS — I10 Essential (primary) hypertension: Secondary | ICD-10-CM

## 2018-11-28 DIAGNOSIS — E7800 Pure hypercholesterolemia, unspecified: Secondary | ICD-10-CM

## 2018-12-08 ENCOUNTER — Ambulatory Visit: Payer: Medicare Other | Admitting: Physician Assistant

## 2018-12-15 ENCOUNTER — Ambulatory Visit (INDEPENDENT_AMBULATORY_CARE_PROVIDER_SITE_OTHER): Payer: Medicare Other | Admitting: Physician Assistant

## 2018-12-15 ENCOUNTER — Encounter: Payer: Self-pay | Admitting: Physician Assistant

## 2018-12-15 VITALS — BP 127/64 | HR 81 | Ht 66.0 in | Wt 131.0 lb

## 2018-12-15 DIAGNOSIS — M7521 Bicipital tendinitis, right shoulder: Secondary | ICD-10-CM | POA: Diagnosis not present

## 2018-12-15 DIAGNOSIS — R159 Full incontinence of feces: Secondary | ICD-10-CM

## 2018-12-15 DIAGNOSIS — N39498 Other specified urinary incontinence: Secondary | ICD-10-CM

## 2018-12-15 DIAGNOSIS — R152 Fecal urgency: Secondary | ICD-10-CM

## 2018-12-15 DIAGNOSIS — H6982 Other specified disorders of Eustachian tube, left ear: Secondary | ICD-10-CM

## 2018-12-15 DIAGNOSIS — H6992 Unspecified Eustachian tube disorder, left ear: Secondary | ICD-10-CM

## 2018-12-15 MED ORDER — FLUTICASONE PROPIONATE 50 MCG/ACT NA SUSP: NASAL | 2 refills | Status: DC

## 2018-12-15 MED ORDER — DICLOFENAC SODIUM 1 % TD GEL: 4.0000 g | Freq: Four times a day (QID) | TRANSDERMAL | 1 refills | Status: DC

## 2018-12-15 NOTE — Progress Notes (Signed)
Subjective:    Patient ID: Mark Mora, male    DOB: 26-Sep-1920, 85 y.o.   MRN: [id number]  HPI  Pt is a 85 yo male who presents to the clinic for 3 month follow up.   Overall he is doing pretty well. He does have a few complaints.   Left ear feels like popping and pressure in it. Hx of ear wax. Would like evaluated. No pain. No URI symptoms.   He has also been SOB a few times when tried to move around. It comes and goes. No cough, wheezing, CP. Some days better than others.   He has some right arm pain in the shoulder and radiates into bicep. Seems to be getting a little better. He is right handed. Not tried anything.   He has had a few stool accidents with urgency and continues to have urinary frequency and urgency.wonders if there is anything he can do.   .. Active Ambulatory Problems    Diagnosis Date Noted  . CKD (chronic kidney disease) stage 3, GFR 30-59 ml/min (HCC) 11/05/2017  . 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 Essential hypertension 11/05/2017  . Carotid atherosclerosis, right 11/05/2017  . Pure hypercholesterolemia 11/05/2017  . Screening for diabetes mellitus 11/05/2017  . Hearing loss due to cerumen impaction, bilateral 11/05/2017  . Ingrown toenail of left foot 03/20/2018  . Toenail fungus 03/20/2018  . Hearing loss of left ear due to cerumen impaction 03/20/2018  . Gastroesophageal reflux disease with esophagitis 03/23/2018  . Benign prostatic hyperplasia with urinary frequency 03/23/2018  . Neuropathy 03/23/2018  . History of prostate cancer 05/05/2018  . Numbness and tingling of both feet 05/05/2018  . Prostate cancer ([place]) 05/23/2018  . Weight loss 05/23/2018  . Spinal stenosis of lumbar region 05/23/2018  . Pulmonary embolus ([place]) 07/15/2018  . Cerebrovascular accident (CVA) due to thrombosis of precerebral artery ([place]) 07/15/2018  . Paresthesias 07/16/2018  . Hypotension 07/16/2018  . Slow transit constipation  07/26/2018  . Bilateral upper abdominal discomfort 07/26/2018  . Burping 07/26/2018  . Opacity of lung on imaging study 07/26/2018  . Centrilobular emphysema ([place]) 08/15/2018  . Hyponatremia 08/15/2018  . Dystrophic nail 09/10/2018  . Bilateral edema of lower extremity 09/10/2018  . Chronic venous stasis dermatitis of both lower extremities 09/10/2018  . BPH (benign prostatic hyperplasia) 09/26/2018  . Degenerative joint disease of cervical spine 09/26/2018  . Elevated troponin 07/04/2018  . GERD (gastroesophageal reflux disease) 09/26/2018  . Hyperlipemia 09/26/2018  . Hyperglycemia 07/04/2018  . Bilateral impacted cerumen 11/23/2018   Resolved Ambulatory Problems    Diagnosis Date Noted  . No Resolved Ambulatory Problems   Past Medical History:  Diagnosis Date  . Hyperlipidemia   . Hypertension   . Stroke [facility])       Review of Systems  All other systems reviewed and are negative.      Objective:   Physical Exam Vitals signs reviewed.  Constitutional:      Appearance: Normal appearance.     Comments: In a wheelchair.   HENT:     Head: Normocephalic and atraumatic.     Right Ear: Tympanic membrane and ear canal normal.     Left Ear: Tympanic membrane and ear canal normal.     Nose: Nose normal.     Mouth/Throat:     Mouth: Mucous membranes are moist.     Pharynx: Oropharynx is clear.  Cardiovascular:     Rate and Rhythm: Normal rate  and regular rhythm.  Pulmonary:     Effort: Pulmonary effort is normal.     Breath sounds: Normal breath sounds.  Neurological:     General: No focal deficit present.     Mental Status: He is alert.           Assessment & Plan:  .Marland KitchenDontaye was seen today for follow-up.  Diagnoses and all orders for this visit:  Biceps tendinitis of right upper extremity -     diclofenac sodium (VOLTAREN) 1 % GEL; Apply 4 g topically 4 (four) times daily. To affected joint.  Other urinary incontinence  Incontinence of feces with fecal  urgency  Dysfunction of left eustachian tube -     fluticasone (FLONASE) 50 MCG/ACT nasal spray; 1 spray by Both Nostrils route daily.   Overall physical exam seems to support some bicep tendonitis. Discussed ice, rest, tens unit and given voltaren gel to use. Stretches given to start. Follow up as needed.   Ears look good. Given flonase to use as needed to help with ETD.   Discussed adding more meds to see if could help symptoms of urinary and fecal incotience;however, the frequency of fecal incotience is only once a in a while and opted not to treat. He does not have a prostate and could consider going off flomax and see if helps with symptoms. Pt will consider.   Follow up in 3 months. Vitals look great.

## 2018-12-15 NOTE — Patient Instructions (Addendum)
Try to use flonase 2 sprays each nostril.   Fecal Incontinence Fecal incontinence, also called accidental bowel leakage, is not being able to control your bowels. This condition happens because the nerves or muscles around the anus do not work the way they should. This affects their ability to hold stool (feces). What are the causes? This condition may be caused by:  Damage to the muscles at the end of the rectum (sphincter).  Damage to the nerves that control bowel movements.  Diarrhea.  Chronic constipation.  Pelvic floor dysfunction. This means the muscles in the pelvis do not work well.  Loss of bowel storage capacity. This occurs when the rectum can no longer stretch in size in order to store feces.  Inflammatory bowel disease (IBD), such as Crohn's disease.  Irritable bowel syndrome (IBS). What increases the risk? You are more likely to develop this condition if you:  Were born with bowels or a pelvis that did not form correctly.  Have had rectal surgery.  Have had radiation treatment for certain cancers.  Have been pregnant, had a vaginal delivery, or had surgery that damaged the pelvic floor muscles.  Had a complicated childbirth, spinal cord injury, or other trauma that caused nerve damage.  Have a condition that can affect nerve function, such as diabetes, Parkinson's disease, or multiple sclerosis.  Have a condition where the rectum drops down into the anus or vagina (prolapse).  Are 22 years of age or older. What are the signs or symptoms? The main symptom of this condition is not being able to control your bowels. You also might not be able to get to the bathroom before a bowel movement. How is this diagnosed? This condition is diagnosed with a medical history and physical exam. You may also have other tests, including:  Blood tests.  Urine tests.  A rectal exam.  Ultrasound.  MRI.  Colonoscopy. This is an exam that looks at your large intestine  (colon).  Anal manometry. This is a test that measures the strength of the anal sphincter.  Anal electromyogram (EMG). This is a test that uses small electrodes to check for nerve damage. How is this treated? Treatment for this condition depends on the cause and severity. Treatment may also focus on addressing any underlying causes of this condition. Treatment may include:  Medicines. This may include medicines to: ? Prevent diarrhea. ? Help with constipation (bulk-forming laxatives). ? Treat any underlying conditions.  Biofeedback therapy. This can help to retrain muscles that are affected.  Fiber supplements. These can help manage your bowel movements.  Nerve stimulation.  Injectable gel to promote tissue growth and better muscle control.  Surgery. You may need: ? Sphincter repair surgery. ? Diversion surgery. This procedure lets feces pass out of your body through a hole in your abdomen. Follow these instructions at home: Eating and drinking   Follow instructions from your health care provider about any eating or drinking restrictions. ? Work with a dietitian to come up with a healthy diet that will help you avoid the foods that can make your condition worse. ? Keep a diet diary to find out which foods or drinks could be making your condition worse.  Drink enough fluid to keep your urine pale yellow. Lifestyle  Do not use any products that contain nicotine or tobacco, such as cigarettes and e-cigarettes. If you need help quitting, ask your health care provider. This may help your condition.  If you are overweight, talk with your health care  provider about how to safely lose weight. This may help your condition.  Increase your physical activity as told by your health care provider. This may help your condition. Always talk with your health care provider before starting a new exercise program.  Carry a change of clothes and supplies to clean up quickly if you have an episode  of fetal incontinence.  Consider joining a fecal incontinence support group. You can find a support group online or in your local community. General instructions   Take over-the-counter and prescription medicines only as told by your health care provider. This includes any supplements.  Apply a moisture barrier, such as petroleum jelly, to your rectum. This protects the skin from irritation caused by ongoing leaking or diarrhea.  Tell your health care provider if you are upset or depressed about your condition.  Keep all follow-up visits as told by your health care provider. This is important. Where to find more information  International Foundation for Functional Gastrointestinal Disorders: iffgd.[employer]: patients.gi.org Contact a health care provider if:  You have a fever.  You have redness, swelling, or pain around your rectum.  Your pain is getting worse or you lose feeling in your rectal area.  You have blood in your stool.  You feel sad or hopeless.  You avoid social or work situations. Get help right away if:  You stop having bowel movements.  You cannot eat or drink without vomiting.  You have rectal bleeding that does not stop.  You have severe pain that is getting worse.  You have symptoms of dehydration, including: ? Sleepiness or fatigue. ? Producing little or no urine, tears, or sweat. ? Dizziness. ? Dry mouth. ? Unusual irritability. ? Headache. ? Inability to think clearly. Summary  Fecal incontinence, also called accidental bowel leakage, is not being able to control your bowels. This condition happens because the nerves or muscles around the anus do not work the way they should.  Treatment varies depending on the cause and severity of your condition. Treatment may also focus on addressing any underlying causes of this condition.  Follow instructions from your health care provider about any eating or drinking  restrictions, lifestyle changes, and skin care.  Take over-the-counter and prescription medicines only as told by your health care provider. This includes any supplements.  Tell your health care provider if your symptoms worsen or if you are upset or depressed about your condition. This information is not intended to replace advice given to you by your health care provider. Make sure you discuss any questions you have with your health care provider. Document Released: 11/07/2004 Document Revised: 04/10/2018 Document Reviewed: 04/10/2018 Elsevier Interactive Patient Education  2019 [employer].  Eustachian Tube Dysfunction  Eustachian tube dysfunction refers to a condition in which a blockage develops in the narrow passage that connects the middle ear to the back of the nose (eustachian tube). The eustachian tube regulates air pressure in the middle ear by letting air move between the ear and nose. It also helps to drain fluid from the middle ear space. Eustachian tube dysfunction can affect one or both ears. When the eustachian tube does not function properly, air pressure, fluid, or both can build up in the middle ear. What are the causes? This condition occurs when the eustachian tube becomes blocked or cannot open normally. Common causes of this condition include:  Ear infections.  Colds and other infections that affect the nose, mouth, and throat (upper respiratory  tract).  Allergies.  Irritation from cigarette smoke.  Irritation from stomach acid coming up into the esophagus (gastroesophageal reflux). The esophagus is the tube that carries food from the mouth to the stomach.  Sudden changes in air pressure, such as from descending in an airplane or scuba diving.  Abnormal growths in the nose or throat, such as: ? Growths that line the nose (nasal polyps). ? Abnormal growth of cells (tumors). ? Enlarged tissue at the back of the throat (adenoids). What increases the risk? You  are more likely to develop this condition if:  You smoke.  You are overweight.  You are a child who has: ? Certain birth defects of the mouth, such as cleft palate. ? Large tonsils or adenoids. What are the signs or symptoms? Common symptoms of this condition include:  A feeling of fullness in the ear.  Ear pain.  Clicking or popping noises in the ear.  Ringing in the ear.  Hearing loss.  Loss of balance.  Dizziness. Symptoms may get worse when the air pressure around you changes, such as when you travel to an area of high elevation, fly on an airplane, or go scuba diving. How is this diagnosed? This condition may be diagnosed based on:  Your symptoms.  A physical exam of your ears, nose, and throat.  Tests, such as those that measure: ? The movement of your eardrum (tympanogram). ? Your hearing (audiometry). How is this treated? Treatment depends on the cause and severity of your condition.  In mild cases, you may relieve your symptoms by moving air into your ears. This is called "popping the ears."  In more severe cases, or if you have symptoms of fluid in your ears, treatment may include: ? Medicines to relieve congestion (decongestants). ? Medicines that treat allergies (antihistamines). ? Nasal sprays or ear drops that contain medicines that reduce swelling (steroids). ? A procedure to drain the fluid in your eardrum (myringotomy). In this procedure, a small tube is placed in the eardrum to:  Drain the fluid.  Restore the air in the middle ear space. ? A procedure to insert a balloon device through the nose to inflate the opening of the eustachian tube (balloon dilation). Follow these instructions at home: Lifestyle  Do not do any of the following until your health care provider approves: ? Travel to high altitudes. ? Fly in airplanes. ? Work in a Pension scheme manager or room. ? Scuba dive.  Do not use any products that contain nicotine or tobacco, such  as cigarettes and e-cigarettes. If you need help quitting, ask your health care provider.  Keep your ears dry. Wear fitted earplugs during showering and bathing. Dry your ears completely after. General instructions  Take over-the-counter and prescription medicines only as told by your health care provider.  Use techniques to help pop your ears as recommended by your health care provider. These may include: ? Chewing gum. ? Yawning. ? Frequent, forceful swallowing. ? Closing your mouth, holding your nose closed, and gently blowing as if you are trying to blow air out of your nose.  Keep all follow-up visits as told by your health care provider. This is important. Contact a health care provider if:  Your symptoms do not go away after treatment.  Your symptoms come back after treatment.  You are unable to pop your ears.  You have: ? A fever. ? Pain in your ear. ? Pain in your head or neck. ? Fluid draining from your ear.  Your hearing suddenly changes.  You become very dizzy.  You lose your balance. Summary  Eustachian tube dysfunction refers to a condition in which a blockage develops in the eustachian tube.  It can be caused by ear infections, allergies, inhaled irritants, or abnormal growths in the nose or throat.  Symptoms include ear pain, hearing loss, or ringing in the ears.  Mild cases are treated with maneuvers to unblock the ears, such as yawning or ear popping.  Severe cases are treated with medicines. Surgery may also be done (rare). This information is not intended to replace advice given to you by your health care provider. Make sure you discuss any questions you have with your health care provider. Document Released: 12/23/2015 Document Revised: 03/18/2018 Document Reviewed: 03/18/2018 Elsevier Interactive Patient Education  2019 [employer].

## 2018-12-25 ENCOUNTER — Encounter: Payer: Self-pay | Admitting: Podiatry

## 2018-12-25 ENCOUNTER — Ambulatory Visit: Payer: Medicare Other | Admitting: Podiatry

## 2018-12-25 VITALS — BP 145/74 | HR 79

## 2018-12-25 DIAGNOSIS — L89621 Pressure ulcer of left heel, stage 1: Secondary | ICD-10-CM

## 2018-12-25 DIAGNOSIS — B351 Tinea unguium: Secondary | ICD-10-CM | POA: Diagnosis not present

## 2018-12-25 DIAGNOSIS — M79675 Pain in left toe(s): Secondary | ICD-10-CM | POA: Diagnosis not present

## 2018-12-25 DIAGNOSIS — M79674 Pain in right toe(s): Secondary | ICD-10-CM | POA: Diagnosis not present

## 2018-12-25 DIAGNOSIS — L89611 Pressure ulcer of right heel, stage 1: Secondary | ICD-10-CM | POA: Diagnosis not present

## 2019-01-04 NOTE — Progress Notes (Signed)
Subjective:  Patient ID: Mark Mora, male    DOB: 1920-07-03,  MRN: [id number]  Chief Complaint  Patient presents with  . Nail Problem    bilateral long painful toenails    85 y.o. male presents with the above complaint.  Reports painfully elongated nails to both feet.On eliqius. Complains of swelling to the legs.  Review of Systems: Negative except as noted in the HPI. Denies N/V/F/Ch.  Past Medical History:  Diagnosis Date  . Carotid atherosclerosis, right 11/05/2017  . CKD (chronic kidney disease) stage 3, GFR 30-59 ml/min (HCC) 11/05/2017  . Essential hypertension 11/05/2017  . Hearing loss due to cerumen impaction, bilateral 11/05/2017  . Hyperlipidemia   . Hypertension   . Pure hypercholesterolemia 11/05/2017  . Screening for diabetes mellitus 11/05/2017  . Stroke ([place])   . 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  acetaminophen (TYLENOL) 650 MG CR tablet, Take by mouth., Disp: , Rfl:  .  amLODipine (NORVASC) 2.5 MG tablet, Take 1 tablet (2.5 mg total) by mouth daily., Disp: 30 tablet, Rfl: 1 .  atorvastatin (LIPITOR) 40 MG tablet, TAKE 1 TABLET BY MOUTH  DAILY, Disp: 90 tablet, Rfl: 1 .  Cholecalciferol (VITAMIN D3) 2000 units TABS, Take by mouth., Disp: , Rfl:  .  cloNIDine (CATAPRES) 0.1 MG tablet, TAKE 1 TABLET BY MOUTH EVERY 6 HOURS AS NEEDED (SBP 170 OR DBP 100) FOR UP TO 30 DAYS., Disp: , Rfl: 0 .  diclofenac sodium (VOLTAREN) 1 % GEL, Apply 4 g topically 4 (four) times daily. To affected joint., Disp: 100 g, Rfl: 1 .  ELIQUIS 5 MG TABS tablet, TAKE 1 TABLET BY MOUTH TWO  TIMES DAILY, Disp: 180 tablet, Rfl: 0 .  famotidine (PEPCID) 20 MG tablet, Take 1 tablet (20 mg total) by mouth 2 (two) times daily., Disp: 180 tablet, Rfl: 1 .  fluticasone (FLONASE) 50 MCG/ACT nasal spray, 1 spray by Both Nostrils route daily., Disp: 16 g, Rfl: 2 .  furosemide (LASIX) 20 MG tablet, TAKE 1 TABLET BY MOUTH  DAILY, Disp:  90 tablet, Rfl: 0 .  ipratropium (ATROVENT) 0.06 % nasal spray, Place 2 sprays into both nostrils 4 (four) times daily as needed for rhinitis., Disp: 15 mL, Rfl: 0 .  lisinopril (PRINIVIL,ZESTRIL) 20 MG tablet, TAKE 1 TABLET BY MOUTH  DAILY, Disp: 90 tablet, Rfl: 1 .  tamsulosin (FLOMAX) 0.4 MG CAPS capsule, Take 1 capsule (0.4 mg total) by mouth daily., Disp: 90 capsule, Rfl: 3 .  umeclidinium-vilanterol (ANORO ELLIPTA) 62.5-25 MCG/INH AEPB, Inhale 1 puff into the lungs daily., Disp: 90 each, Rfl: 1  Social History   Tobacco Use  Smoking Status Former Smoker  . Packs/day: 0.25  . Years: 6.00  . Pack years: 1.50  . Types: Cigarettes  Smokeless Tobacco Never Used  Tobacco Comment   quit age 53    No Known Allergies Objective:  There were no vitals filed for this visit. There is no height or weight on file to calculate BMI. Constitutional Well developed. Well nourished.  Vascular Dorsalis pedis pulses palpable bilaterally. Posterior tibial pulses palpable bilaterally. Capillary refill normal to all digits.  No cyanosis or clubbing noted. Pedal hair growth normal.  Neurologic Normal speech. Oriented to person, place, and time. Epicritic sensation to light touch grossly present bilaterally.  Dermatologic Nails elongated dystrophic pain to palpation No open wounds. No skin lesions.  Orthopedic: Normal joint ROM without pain or crepitus bilaterally. No visible  deformities. No bony tenderness.   Radiographs: None Assessment:   1. Onychomycosis   2. Coagulation defect ([place])   3. Venous (peripheral) insufficiency   4. Localized edema    Plan:  Patient was evaluated and treated and all questions answered.  Onychomycosis with coagulation defect -Nails palliatively debridement as below -Educated on self-care  Procedure: Nail Debridement Rationale: Pain Type of Debridement: manual, sharp debridement. Instrumentation: Nail nipper, rotary burr. Number of Nails: 10  LE  Edema -Rx for compression socks written  Return in about 3 months (around 12/27/2018) for Routine Foot Care - On Eliquis.

## 2019-01-11 ENCOUNTER — Encounter: Payer: Self-pay | Admitting: Podiatry

## 2019-01-11 NOTE — Progress Notes (Signed)
Subjective: Mark Mora presents today with painful, thick toenails 1-5 b/l that he cannot cut and which interfere with daily activities.  Pain is aggravated when wearing enclosed shoe gear.  Patient's daughter is also present during the visit. She assists with his care.   He is also complaining of painful heels b/l. Sensation is described as burning.Also relates skin is peeling off of right heel.  His favored sleeping position is on his back and he does not turn during the night.   Pt also complains of spot on left great toe for the past 3 months.  Donella Stade, PA-C is his PCP.    Current Outpatient Medications:  .  acetaminophen (TYLENOL) 650 MG CR tablet, Take by mouth., Disp: , Rfl:  .  amLODipine (NORVASC) 2.5 MG tablet, Take 1 tablet (2.5 mg total) by mouth daily., Disp: 30 tablet, Rfl: 1 .  atorvastatin (LIPITOR) 40 MG tablet, TAKE 1 TABLET BY MOUTH  DAILY, Disp: 90 tablet, Rfl: 1 .  Cholecalciferol (VITAMIN D3) 2000 units TABS, Take by mouth., Disp: , Rfl:  .  cloNIDine (CATAPRES) 0.1 MG tablet, TAKE 1 TABLET BY MOUTH EVERY 6 HOURS AS NEEDED (SBP 170 OR DBP 100) FOR UP TO 30 DAYS., Disp: , Rfl: 0 .  diclofenac sodium (VOLTAREN) 1 % GEL, Apply 4 g topically 4 (four) times daily. To affected joint., Disp: 100 g, Rfl: 1 .  ELIQUIS 5 MG TABS tablet, TAKE 1 TABLET BY MOUTH TWO  TIMES DAILY, Disp: 180 tablet, Rfl: 0 .  famotidine (PEPCID) 20 MG tablet, Take 1 tablet (20 mg total) by mouth 2 (two) times daily., Disp: 180 tablet, Rfl: 1 .  fluticasone (FLONASE) 50 MCG/ACT nasal spray, 1 spray by Both Nostrils route daily., Disp: 16 g, Rfl: 2 .  furosemide (LASIX) 20 MG tablet, TAKE 1 TABLET BY MOUTH  DAILY, Disp: 90 tablet, Rfl: 0 .  ipratropium (ATROVENT) 0.06 % nasal spray, Place 2 sprays into both nostrils 4 (four) times daily as needed for rhinitis., Disp: 15 mL, Rfl: 0 .  lisinopril (PRINIVIL,ZESTRIL) 20 MG tablet, TAKE 1 TABLET BY MOUTH  DAILY, Disp: 90 tablet, Rfl: 1 .   tamsulosin (FLOMAX) 0.4 MG CAPS capsule, Take 1 capsule (0.4 mg total) by mouth daily., Disp: 90 capsule, Rfl: 3 .  umeclidinium-vilanterol (ANORO ELLIPTA) 62.5-25 MCG/INH AEPB, Inhale 1 puff into the lungs daily., Disp: 90 each, Rfl: 1  No Known Allergies  Objective:  Vascular Examination: Capillary refill time immediate x 10 digits  Dorsalis pedis and Posterior tibial pulses palpable b/l  Digital hair present x 10 digits  Skin temperature gradient WNL b/l  Dermatological Examination: Skin with normal turgor, texture and tone b/l  Toenails 1-5 b/l discolored, thick, dystrophic with subungual debris and pain with palpation to nailbeds due to thickness of nails.  He has a scab on his left great toe. No erythema, no edema, no drainage.   Posterior heels noted to have stage 1 pressure sores. + blanchable erythema. No edema, no flocculence, no drainage, no warmth, no blister formation.   Musculoskeletal: Muscle strength 5/5 to all LE muscle groups  No gross bony deformities b/l.  No pain, crepitus or joint limitation noted with ROM.   Neurological: Sensation intact with 10 gram monofilament. Vibratory sensation intact.  Assessment: Painful onychomycosis toenails 1-5 b/l  Stage 1 pressure sores b/l heels  Plan: 1. Toenails 1-5 b/l were debrided in length and girth without iatrogenic bleeding. 2. Advised daughter to apply antibtiotic ointment to  left great toe once daily x 1 week. 3. For pressure ulcers b/l heels, recommended aggressive offloading with heel pillows. Prescriptions given for one pair of heel pillows and he is to use them any time he is in the bed. Daughter and [name] related understanding. Explained the heel pain will resolve if you remove the source of pressure. 4. Patient to continue soft, supportive shoe gear 5. Patient to report any pedal injuries to medical professional immediately. 6. Follow up 3 months. Patient/POA to call should there be a concern  in the interim.

## 2019-01-12 ENCOUNTER — Ambulatory Visit (INDEPENDENT_AMBULATORY_CARE_PROVIDER_SITE_OTHER): Payer: Medicare Other

## 2019-01-12 ENCOUNTER — Encounter: Payer: Self-pay | Admitting: Physician Assistant

## 2019-01-12 ENCOUNTER — Ambulatory Visit (INDEPENDENT_AMBULATORY_CARE_PROVIDER_SITE_OTHER): Payer: Medicare Other | Admitting: Physician Assistant

## 2019-01-12 VITALS — BP 148/76 | HR 84 | Temp 97.9°F | Ht 66.0 in | Wt 138.0 lb

## 2019-01-12 DIAGNOSIS — M25511 Pain in right shoulder: Secondary | ICD-10-CM

## 2019-01-12 DIAGNOSIS — G8929 Other chronic pain: Secondary | ICD-10-CM | POA: Diagnosis not present

## 2019-01-12 DIAGNOSIS — I878 Other specified disorders of veins: Secondary | ICD-10-CM

## 2019-01-12 DIAGNOSIS — R600 Localized edema: Secondary | ICD-10-CM

## 2019-01-12 NOTE — Patient Instructions (Addendum)
15-20 mm compression stockings.  Lasix as needed.  Use flonase daily.  Add claritin daily.

## 2019-01-13 NOTE — Progress Notes (Signed)
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 No acute findings. Some of his pain could be arthritis in shoulder but I still suspect some bicep tendonitis in upper right arm. Treatment plan stays the same. I do think physical therapy could help. Would he be interested?

## 2019-01-15 NOTE — Progress Notes (Signed)
Does he want someone to come to his home or does he want to go outpatient?

## 2019-01-19 DIAGNOSIS — I878 Other specified disorders of veins: Secondary | ICD-10-CM | POA: Insufficient documentation

## 2019-01-19 DIAGNOSIS — G8929 Other chronic pain: Secondary | ICD-10-CM | POA: Insufficient documentation

## 2019-01-19 DIAGNOSIS — M25511 Pain in right shoulder: Principal | ICD-10-CM

## 2019-01-19 NOTE — Progress Notes (Signed)
Subjective:    Patient ID: Mark Mora, male    DOB: January 31, 1920, 85 y.o.   MRN: [id number]  HPI Pt is a 85 yo male who presents to the clinic with his daughter to follow up on right shoulder pain.   At this point he has had right shoulder pain for about 6 months or longer. He denies any injury. It does not seem to get better. He feels like he can do less and less with it. He cannot take NSAIDs. The topical get does seem to help.   Pt went to podiatrist. She note. Continues to have some pain and burning in the feet. He is not wearing compression stockings.   Having some intermittent ear popping and fullness. No pain. Would like me to look at.   .. Active Ambulatory Problems    Diagnosis Date Noted  . CKD (chronic kidney disease) stage 3, GFR 30-59 ml/min (HCC) 11/05/2017  . 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 Essential hypertension 11/05/2017  . Carotid atherosclerosis, right 11/05/2017  . Pure hypercholesterolemia 11/05/2017  . Screening for diabetes mellitus 11/05/2017  . Hearing loss due to cerumen impaction, bilateral 11/05/2017  . Ingrown toenail of left foot 03/20/2018  . Toenail fungus 03/20/2018  . Hearing loss of left ear due to cerumen impaction 03/20/2018  . Gastroesophageal reflux disease with esophagitis 03/23/2018  . Benign prostatic hyperplasia with urinary frequency 03/23/2018  . Neuropathy 03/23/2018  . History of prostate cancer 05/05/2018  . Numbness and tingling of both feet 05/05/2018  . Prostate cancer ([place]) 05/23/2018  . Weight loss 05/23/2018  . Spinal stenosis of lumbar region 05/23/2018  . Pulmonary embolus ([place]) 07/15/2018  . Cerebrovascular accident (CVA) due to thrombosis of precerebral artery ([place]) 07/15/2018  . Paresthesias 07/16/2018  . Hypotension 07/16/2018  . Slow transit constipation 07/26/2018  . Bilateral upper abdominal discomfort 07/26/2018  . Burping 07/26/2018  . Opacity of lung on imaging study  07/26/2018  . Centrilobular emphysema ([place]) 08/15/2018  . Hyponatremia 08/15/2018  . Dystrophic nail 09/10/2018  . Bilateral edema of lower extremity 09/10/2018  . Chronic venous stasis dermatitis of both lower extremities 09/10/2018  . BPH (benign prostatic hyperplasia) 09/26/2018  . Degenerative joint disease of cervical spine 09/26/2018  . Elevated troponin 07/04/2018  . GERD (gastroesophageal reflux disease) 09/26/2018  . Hyperlipemia 09/26/2018  . Hyperglycemia 07/04/2018  . Bilateral impacted cerumen 11/23/2018  . Chronic venous stasis 01/19/2019  . Chronic right shoulder pain 01/19/2019   Resolved Ambulatory Problems    Diagnosis Date Noted  . No Resolved Ambulatory Problems   Past Medical History:  Diagnosis Date  . Hyperlipidemia   . Hypertension   . Stroke [facility])       Review of Systems See HPI.     Objective:   Physical Exam Vitals signs reviewed.  HENT:     Head: Normocephalic.  Cardiovascular:     Rate and Rhythm: Normal rate and regular rhythm.  Pulmonary:     Effort: Pulmonary effort is normal.  Musculoskeletal:     Comments: Right shoulder: Passive ROM full without pain.  Active limited due to pain to abduction about 120 degrees.  Tenderness over anterior shoulder and down into bicep.  Flexion of bicep tendon some minor pain.  Strength 4/5.  Muscle atrophy noted.   Skin:    Comments: 1 plus edema bilateral feet.  Some dead skin around left great toe removed.  No ulcers detected.   Neurological:  General: No focal deficit present.     Mental Status: He is alert and oriented to person, place, and time.  Psychiatric:        Mood and Affect: Mood normal.           Assessment & Plan:  .Marland KitchenDaylyn was seen today for foot pain.  Diagnoses and all orders for this visit:  Chronic right shoulder pain -     DG Shoulder Right -     Ambulatory referral to Physical Therapy  Edema of both feet  Chronic venous stasis   Will get xray. Xray  confirmed arthritis of AC joint. I do also think there is some  some bicep tendonitis. Pt does agree to formal PT. Continue using voltaren gel. Ice regularly.   I do think some of his foot pain is swelling. Discussed more compression and more elevation of feet. Use voltaren gel over heel as long as no open wounds/ulcer. Podiatrist had seen some ulcers but no evidence of today. He has not been using lasix. Ok to use as needed.  Follow up with podiatry.   Reassured his ears look good. Use flonase and claritin daily for ETD.   Follow up in 4-6 weeks.

## 2019-01-20 ENCOUNTER — Telehealth: Payer: Self-pay

## 2019-01-20 NOTE — Telephone Encounter (Signed)
Mark Mora would like to proceed with a MRI.

## 2019-01-27 NOTE — Telephone Encounter (Signed)
Patient advised. He will call back to schedule appointment.

## 2019-01-27 NOTE — Telephone Encounter (Signed)
I think he should make appt with Dr. Georgina Snell first. An injection might really help. 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 I don't suspect a rotator cuff tear and if there was we honestly would try to avoid surgery. Dr. Georgina Snell can do an injection and might get some really good relief.

## 2019-02-05 ENCOUNTER — Telehealth: Payer: Self-pay

## 2019-02-05 DIAGNOSIS — H6982 Other specified disorders of Eustachian tube, left ear: Secondary | ICD-10-CM

## 2019-02-05 NOTE — Telephone Encounter (Signed)
Mark Mora would like a referral to ENT for his ear. Please advise.

## 2019-02-09 NOTE — Telephone Encounter (Signed)
Patient's daughter advised.  

## 2019-02-09 NOTE — Telephone Encounter (Signed)
Done

## 2019-02-17 ENCOUNTER — Telehealth: Payer: Self-pay

## 2019-02-17 ENCOUNTER — Ambulatory Visit (INDEPENDENT_AMBULATORY_CARE_PROVIDER_SITE_OTHER): Payer: Medicare Other | Admitting: Physician Assistant

## 2019-02-17 ENCOUNTER — Encounter: Payer: Self-pay | Admitting: Physician Assistant

## 2019-02-17 DIAGNOSIS — M67432 Ganglion, left wrist: Secondary | ICD-10-CM | POA: Diagnosis not present

## 2019-02-17 NOTE — Progress Notes (Signed)
Subjective:    CC: Left wrist mass  HPI: This is a pleasant 85 year old male, for the past 2 weeks has had an enlarging, painful mass over the volar left wrist.  No trauma.  Symptoms are moderate, persistent, localized without radiation.  I reviewed the past medical history, family history, social history, surgical history, and allergies today and no changes were needed.  Please see the problem list section below in epic for further details.  Past Medical History: Past Medical History:  Diagnosis Date  . Carotid atherosclerosis, right 11/05/2017  . CKD (chronic kidney disease) stage 3, GFR 30-59 ml/min (HCC) 11/05/2017  . Essential hypertension 11/05/2017  . Hearing loss due to cerumen impaction, bilateral 11/05/2017  . Hyperlipidemia   . Hypertension   . Pure hypercholesterolemia 11/05/2017  . Screening for diabetes mellitus 11/05/2017  . Stroke ([place])   . Varicose veins of right lower extremity with edema 11/05/2017   Past Surgical History: Past Surgical History:  Procedure Laterality Date  . HERNIA REPAIR     Social History: Social History   Socioeconomic History  . Marital status: Widowed    Spouse name: Not on file  . Number of children: 2  . Years of education: 104  . Highest education level: 11th grade  Occupational History  . Occupation: retired    Comment: [employer]  Social Needs  . Financial resource strain: Not hard at all  . Food insecurity:    Worry: Never true    Inability: Never true  . Transportation needs:    Medical: No    Non-medical: Not on file  Tobacco Use  . Smoking status: Former Smoker    Packs/day: 0.25    Years: 6.00    Pack years: 1.50    Types: Cigarettes  . Smokeless tobacco: Never Used  . Tobacco comment: quit age 78  Substance and Sexual Activity  . Alcohol use: No    Frequency: Never  . Drug use: No  . Sexual activity: Never  Lifestyle  . Physical activity:    Days per week: 0 days    Minutes per session: 0 min  . Stress:  Not at all  Relationships  . Social connections:    Talks on phone: More than three times a week    Gets together: Twice a week    Attends religious service: Never    Active member of club or organization: Yes    Attends meetings of clubs or organizations: 1 to 4 times per year    Relationship status: Widowed  Other Topics Concern  . Not on file  Social History Narrative   Reads daily on his IPAD. Coffee daily.    Family History: No family history on file. Allergies: No Known Allergies Medications: See med rec.  Review of Systems: No fevers, chills, night sweats, weight loss, chest pain, or shortness of breath.   Objective:    General: Well Developed, well nourished, and in no acute distress.  Neuro: Alert and oriented x3, extra-ocular muscles intact, sensation grossly intact.  HEENT: Normocephalic, atraumatic, pupils equal round reactive to light, neck supple, no masses, no lymphadenopathy, thyroid nonpalpable.  Skin: Warm and dry, no rashes. Cardiac: Regular rate and rhythm, no murmurs rubs or gallops, no lower extremity edema.  Respiratory: Clear to auscultation bilaterally. Not using accessory muscles, speaking in full sentences. Left wrist: Visible and palpable tender volar ganglion cyst ROM smooth and normal with good flexion and extension and ulnar/radial deviation that is symmetrical with opposite wrist. Palpation  is normal over metacarpals, navicular, lunate, and TFCC; tendons without tenderness/ swelling No snuffbox tenderness. No tenderness over [place]. Strength 5/5 in all directions without pain. Negative tinel's and phalens signs. Negative Finkelstein sign. Negative Watson's test.  Procedure: Real-time Ultrasound Guided  aspiration/injection of left wrist volar ganglion cyst Device: GE Logiq E  Verbal informed consent obtained.  Time-out conducted.  Noted no overlying erythema, induration, or other signs of local infection.  Skin prepped in a sterile  fashion.  Local anesthesia: Topical Ethyl chloride.  With sterile technique and under real time ultrasound guidance:  Taking care to avoid puncture of the radial artery guided an 18-gauge needle into the ganglion, aspirated thick, straw-colored fluid, syringe switched and 1 cc Kenalog 40, 1 cc lidocaine injected easily. Completed without difficulty  Pain immediately resolved suggesting accurate placement of the medication.  Advised to call if fevers/chills, erythema, induration, drainage, or persistent bleeding.  Images permanently stored and available for review in the ultrasound unit.  Impression: Technically successful ultrasound guided injection.  The wrist was then strapped with a compressive dressing.  Impression and Recommendations:    Ganglion cyst of volar aspect of left wrist Aspiration and injection, strapped with compressive dressing. Discussed 50% recurrence rate. Return to see me in a month.   ___________________________________________ Gwen Her. Dianah Field, M.D., ABFM., CAQSM. Primary Care and Sports Medicine [facility]  Adjunct Professor of [employer] Medicine

## 2019-02-17 NOTE — Telephone Encounter (Signed)
Mark Mora states he has not heard from ENT for an appointment.

## 2019-02-17 NOTE — Patient Instructions (Signed)
Ganglion Cyst    A ganglion cyst is a non-cancerous, fluid-filled lump that occurs near a joint or tendon. The cyst grows out of a joint or the lining of a tendon. Ganglion cysts most often develop in the hand or wrist, but they can also develop in the shoulder, elbow, hip, knee, ankle, or foot.  Ganglion cysts are ball-shaped or egg-shaped. Their size can range from the size of a pea to larger than a grape. Increased activity may cause the cyst to get bigger because more fluid starts to build up.  What are the causes?  The exact cause of this condition is not known, but it may be related to:   Inflammation or irritation around the joint.   An injury.   Repetitive movements or overuse.   Arthritis.  What increases the risk?  You are more likely to develop this condition if:   You are a woman.   You are 15-40 years old.  What are the signs or symptoms?  The main symptom of this condition is a lump. It most often appears on the hand or wrist. In many cases, there are no other symptoms, but a cyst can sometimes cause:   Tingling.   Pain.   Numbness.   Muscle weakness.   Weak grip.   Less range of motion in a joint.  How is this diagnosed?  Ganglion cysts are usually diagnosed based on a physical exam. Your health care provider will feel the lump and may shine a light next to it. If it is a ganglion cyst, the light will likely shine through it.  Your health care provider may order an X-ray, ultrasound, or MRI to rule out other conditions.  How is this treated?  Ganglion cysts often go away on their own without treatment. If you have pain or other symptoms, treatment may be needed. Treatment is also needed if the ganglion cyst limits your movement or if it gets infected. Treatment may include:   Wearing a brace or splint on your wrist or finger.   Taking anti-inflammatory medicine.   Having fluid drained from the lump with a needle (aspiration).   Getting a steroid injected into the joint.   Having  surgery to remove the ganglion cyst.   Placing a pad on your shoe or wearing shoes that will not rub against the cyst if it is on your foot.  Follow these instructions at home:   Do not press on the ganglion cyst, poke it with a needle, or hit it.   Take over-the-counter and prescription medicines only as told by your health care provider.   If you have a brace or splint:  ? Wear it as told by your health care provider.  ? Remove it as told by your health care provider. Ask if you need to remove it when you take a shower or a bath.   Watch your ganglion cyst for any changes.   Keep all follow-up visits as told by your health care provider. This is important.  Contact a health care provider if:   Your ganglion cyst becomes larger or more painful.   You have pus coming from the lump.   You have weakness or numbness in the affected area.   You have a fever or chills.  Get help right away if:   You have a fever and have any of these in the cyst area:  ? Increased redness.  ? Red streaks.  ? Swelling.  Summary     A ganglion cyst is a non-cancerous, fluid-filled lump that occurs near a joint or tendon.   Ganglion cysts most often develop in the hand or wrist, but they can also develop in the shoulder, elbow, hip, knee, ankle, or foot.   Ganglion cysts often go away on their own without treatment.  This information is not intended to replace advice given to you by your health care provider. Make sure you discuss any questions you have with your health care provider.  Document Released: 11/23/2000 Document Revised: 07/26/2017 Document Reviewed: 07/26/2017  Elsevier Interactive Patient Education  2019 Elsevier Inc.

## 2019-02-17 NOTE — Assessment & Plan Note (Signed)
Aspiration and injection, strapped with compressive dressing. Discussed 50% recurrence rate. Return to see me in a month.

## 2019-02-17 NOTE — Telephone Encounter (Signed)
I called [facility] and they are going to call patient to get him scheduled - CF

## 2019-02-17 NOTE — Progress Notes (Signed)
   Subjective:    Patient ID: Mark Mora, male    DOB: Jun 01, 1920, 85 y.o.   MRN: [id number]  HPI Pt is a 85 yo male who presents to the clinic with his daughter with a lump on his left wrist that he noticed 2-3 days ago. It has some discomfort to it but mostly keeps "hitting his watch". No known trauma. Not done anything to make better or worse. Not effecting ROM.   Marland Kitchen. Active Ambulatory Problems    Diagnosis Date Noted  . CKD (chronic kidney disease) stage 3, GFR 30-59 ml/min (HCC) 11/05/2017  . 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 Essential hypertension 11/05/2017  . Carotid atherosclerosis, right 11/05/2017  . Pure hypercholesterolemia 11/05/2017  . Screening for diabetes mellitus 11/05/2017  . Hearing loss due to cerumen impaction, bilateral 11/05/2017  . Ingrown toenail of left foot 03/20/2018  . Toenail fungus 03/20/2018  . Hearing loss of left ear due to cerumen impaction 03/20/2018  . Gastroesophageal reflux disease with esophagitis 03/23/2018  . Benign prostatic hyperplasia with urinary frequency 03/23/2018  . Neuropathy 03/23/2018  . History of prostate cancer 05/05/2018  . Numbness and tingling of both feet 05/05/2018  . Prostate cancer ([place]) 05/23/2018  . Weight loss 05/23/2018  . Spinal stenosis of lumbar region 05/23/2018  . Pulmonary embolus ([place]) 07/15/2018  . Cerebrovascular accident (CVA) due to thrombosis of precerebral artery ([place]) 07/15/2018  . Paresthesias 07/16/2018  . Hypotension 07/16/2018  . Slow transit constipation 07/26/2018  . Bilateral upper abdominal discomfort 07/26/2018  . Burping 07/26/2018  . Opacity of lung on imaging study 07/26/2018  . Centrilobular emphysema ([place]) 08/15/2018  . Hyponatremia 08/15/2018  . Dystrophic nail 09/10/2018  . Bilateral edema of lower extremity 09/10/2018  . Chronic venous stasis dermatitis of both lower extremities 09/10/2018  . BPH (benign prostatic hyperplasia) 09/26/2018  .  Degenerative joint disease of cervical spine 09/26/2018  . Elevated troponin 07/04/2018  . GERD (gastroesophageal reflux disease) 09/26/2018  . Hyperlipemia 09/26/2018  . Hyperglycemia 07/04/2018  . Bilateral impacted cerumen 11/23/2018  . Chronic venous stasis 01/19/2019  . Chronic right shoulder pain 01/19/2019  . Ganglion cyst of volar aspect of left wrist 02/17/2019   Resolved Ambulatory Problems    Diagnosis Date Noted  . No Resolved Ambulatory Problems   Past Medical History:  Diagnosis Date  . Hyperlipidemia   . Hypertension   . Stroke [facility])       Review of Systems  All other systems reviewed and are negative.      Objective:   Physical Exam Vitals signs reviewed.  Musculoskeletal:     Comments: Left volar wrist 1cm by 1cm firm mobile nodule, non tender. No warmth or redness.   Neurological:     General: No focal deficit present.     Mental Status: He is oriented to person, place, and time.           Assessment & Plan:  Marland KitchenMarland KitchenDiagnoses and all orders for this visit:  Ganglion cyst of volar aspect of left wrist   Consulted Dr. Darene Lamer to use ultrasound to confirm ganglion cyst and to drain/inject. See attached note.  HO given for ganglion cyst. Encouraged to ice and keep compressed. Follow up with sports medicine.

## 2019-03-05 ENCOUNTER — Other Ambulatory Visit: Payer: Self-pay

## 2019-03-05 DIAGNOSIS — I10 Essential (primary) hypertension: Secondary | ICD-10-CM

## 2019-03-05 MED ORDER — AMLODIPINE BESYLATE 2.5 MG PO TABS: 2.5000 mg | ORAL_TABLET | Freq: Every day | ORAL | 1 refills | Status: DC

## 2019-03-16 ENCOUNTER — Other Ambulatory Visit: Payer: Self-pay | Admitting: Physician Assistant

## 2019-03-16 DIAGNOSIS — I2699 Other pulmonary embolism without acute cor pulmonale: Secondary | ICD-10-CM

## 2019-03-16 DIAGNOSIS — I6300 Cerebral infarction due to thrombosis of unspecified precerebral artery: Secondary | ICD-10-CM

## 2019-03-17 IMAGING — CT CT CHEST W/O CM
2 of 3 series · 15 of 36 positions shown, 18 images · non-contrast
Comparison: 08/07/2017 chest radiograph.

CLINICAL DATA: Questionable pulmonary nodule on chest radiograph.

EXAM:
CT CHEST WITHOUT CONTRAST
TECHNIQUE: Multidetector CT imaging of the chest was performed following the
standard protocol without IV contrast.

[Series 2: thorax · axial · 0.70mm/px · z∈[-301,-31]mm · 12 of 64 slices shown, 15 images]
[im 5/64  mediastinal]
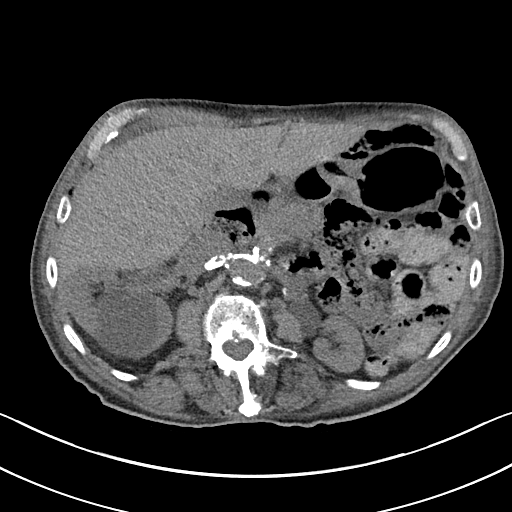
[im 5/64  lung]
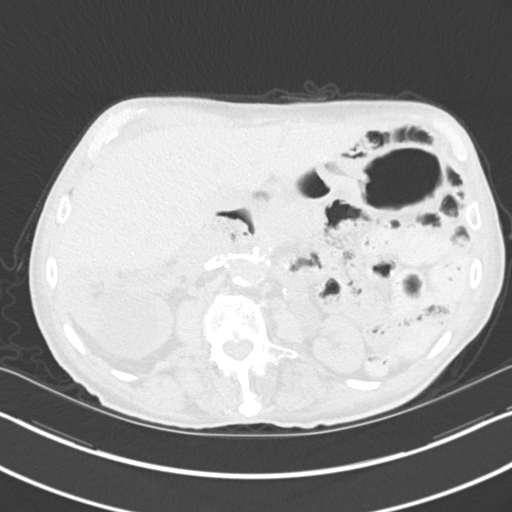
[im 10/64  lung]
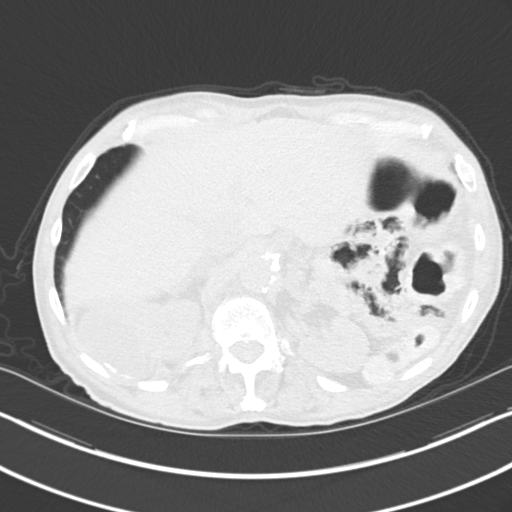
[im 15/64  lung]
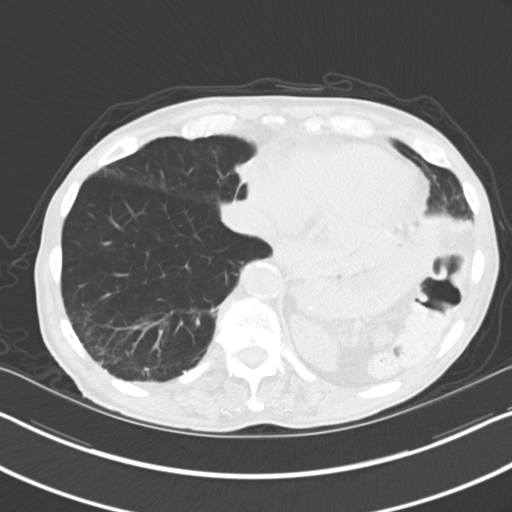
[im 19/64  lung]
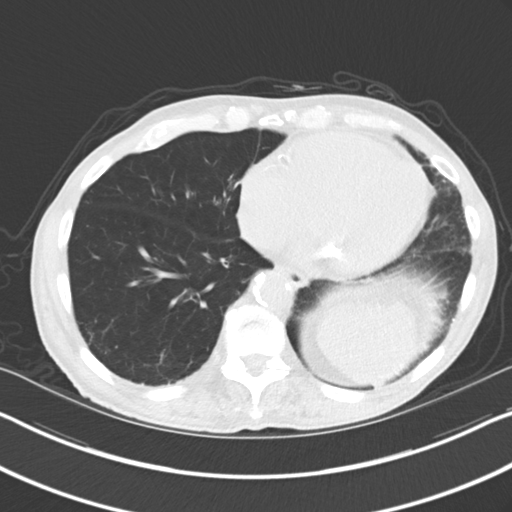
[im 24/64  mediastinal]
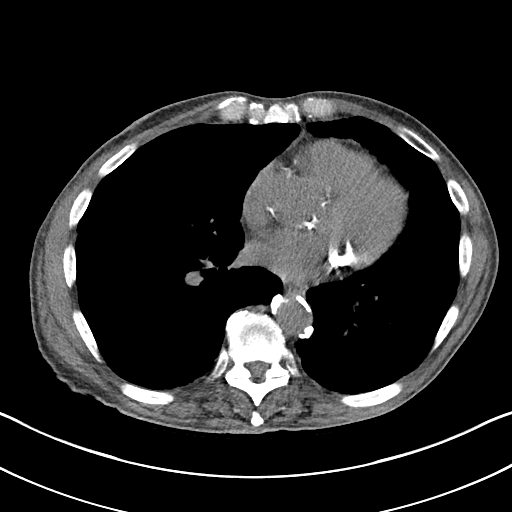
[im 24/64  lung]
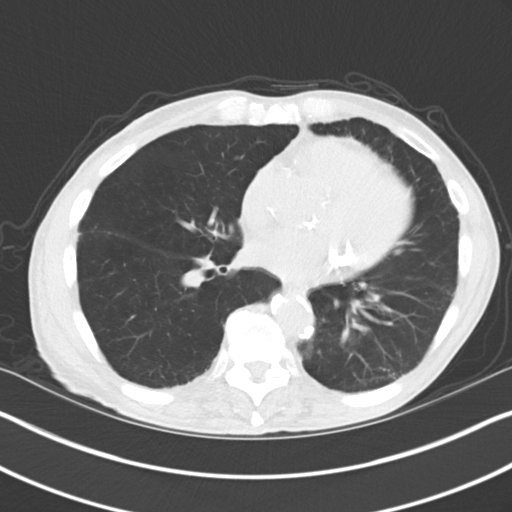
[im 29/64  lung]
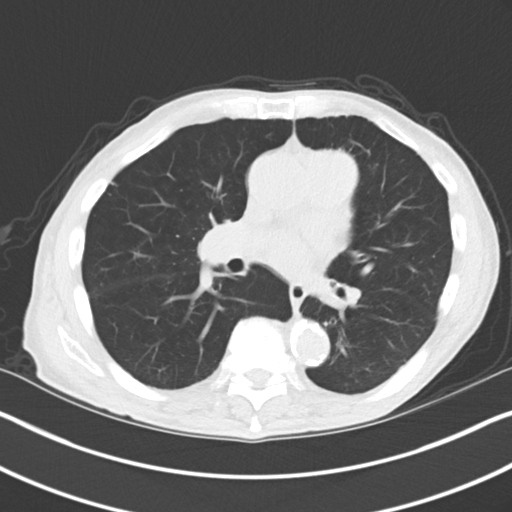
[im 36/64  lung]
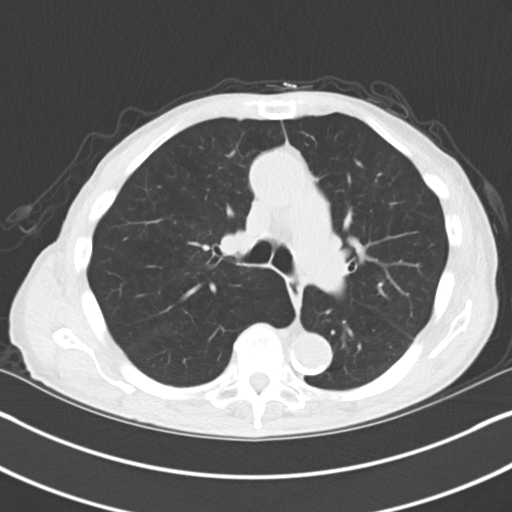
[im 40/64  lung]
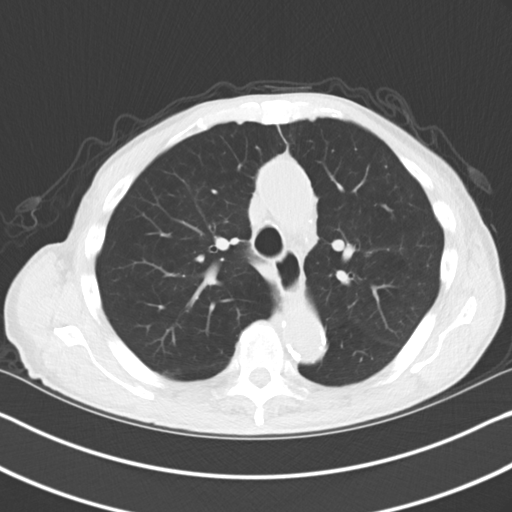
[im 45/64  mediastinal]
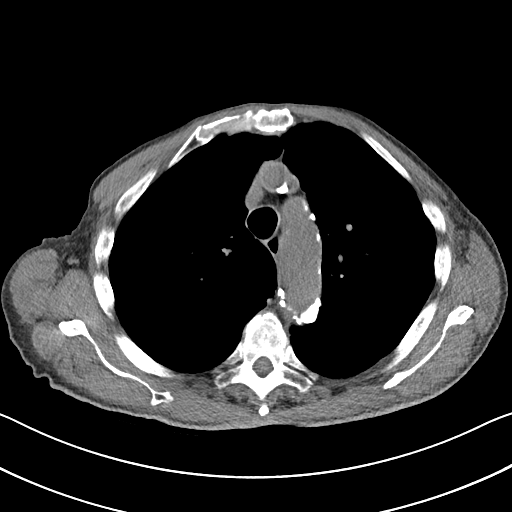
[im 45/64  lung]
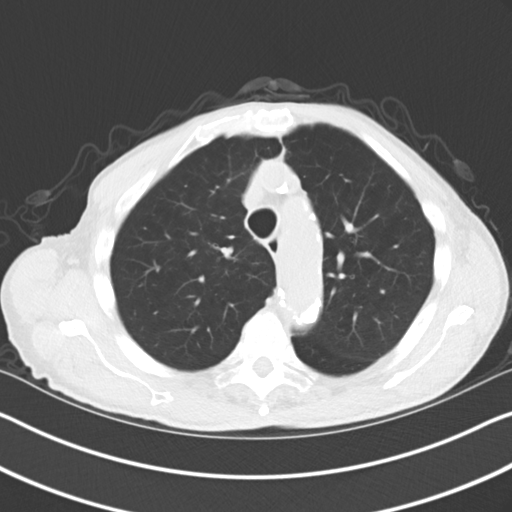
[im 50/64  lung]
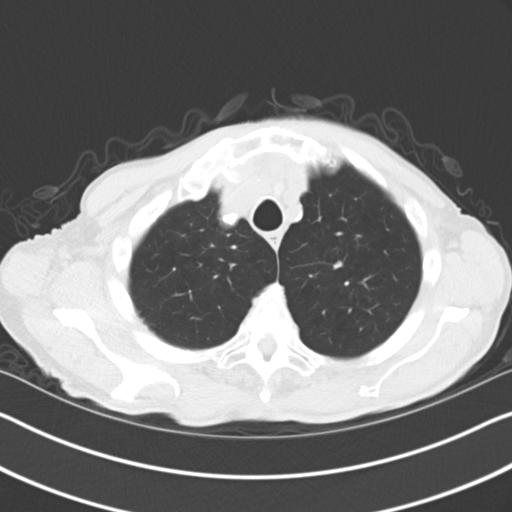
[im 54/64  lung]
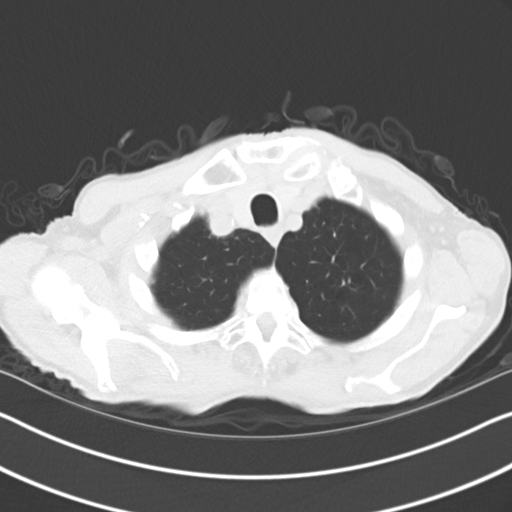
[im 59/64  lung]
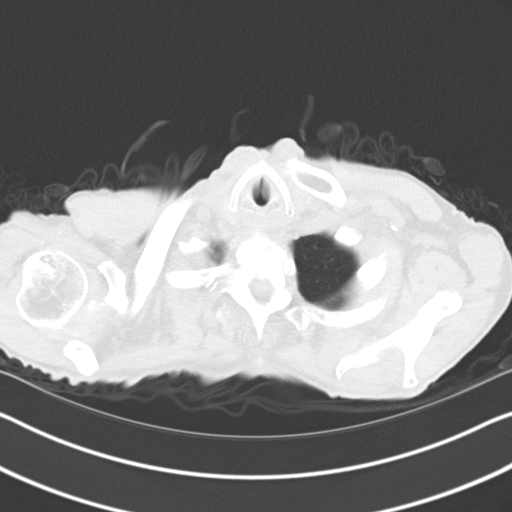

[Series 5: coronal · coronal · 0.64mm/px · 3 of 126 slices shown]
[im 26/126  lung]
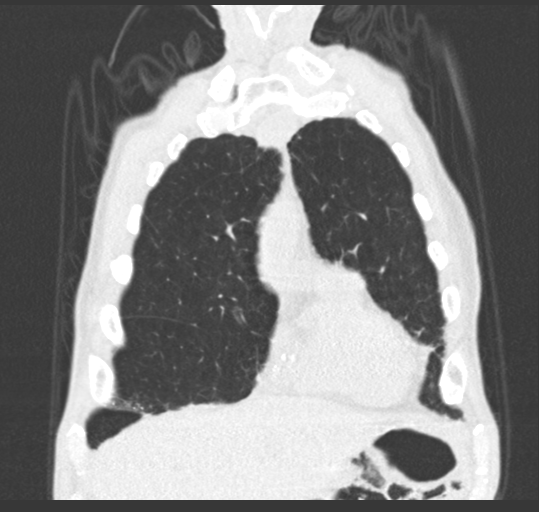
[im 51/126  lung]
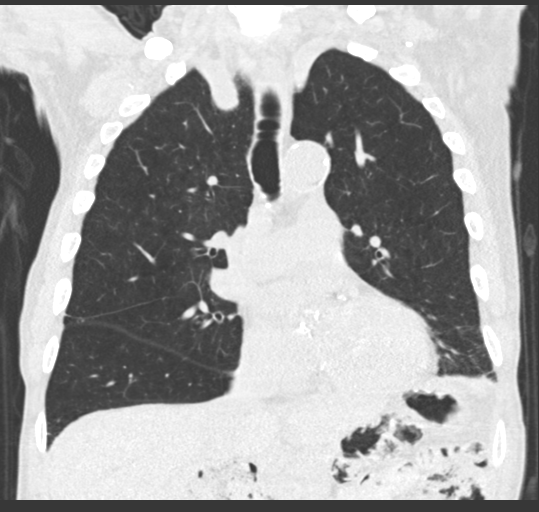
[im 76/126  lung]
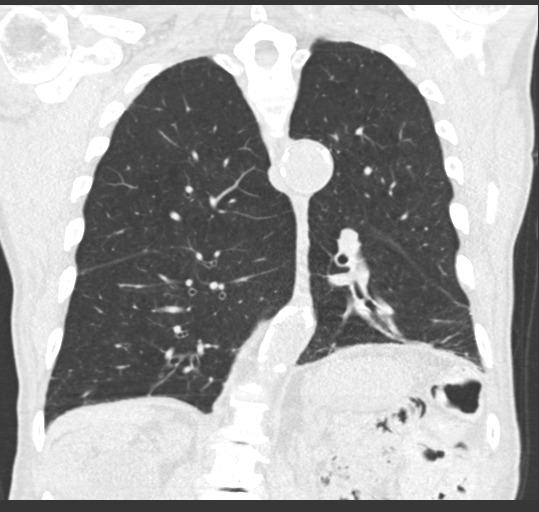

[15 of 36 positions shown; findings below may reference images not displayed]

FINDINGS: Cardiovascular: Normal heart size. No significant pericardial
effusion/thickening. Three-vessel coronary atherosclerosis.
Atherosclerotic nonaneurysmal thoracic aorta. Normal caliber
pulmonary arteries.

Mediastinum/Nodes: No discrete thyroid nodules. Unremarkable
esophagus. No pathologically enlarged axillary, mediastinal or hilar
lymph nodes, noting limited sensitivity for the detection of hilar
adenopathy on this noncontrast study.

Lungs/Pleura: No pneumothorax. No pleural effusion. Moderate
centrilobular emphysema with diffuse bronchial wall thickening.
Subpleural solid 4 mm anterior right lower lobe pulmonary nodule
associated with the major fissure (series 3/image 99). No acute
consolidative airspace disease, lung masses or additional
significant pulmonary nodules. Scattered small parenchymal bands at
the left greater than right lung bases compatible with mild
postinfectious/postinflammatory scarring.

Upper abdomen: Minimally complex 6.1 cm upper right renal cyst with
curvilinear mural calcification. Partially visualize simple 6.8 cm
lower right renal cyst.

Musculoskeletal: No aggressive appearing focal osseous lesions.
Marked thoracic spondylosis.
IMPRESSION: 1. Solitary 4 mm right lower lobe subpleural pulmonary nodule,
probably benign. No follow-up needed if patient is low-risk.
Non-contrast chest CT can be considered in 12 months if patient is
high-risk. This recommendation follows the consensus statement:
Guidelines for Management of Incidental Pulmonary Nodules Detected
on CT Images:From the [HOSPITAL] 1796; published online
before print (10.1148/radiol.6476767616).
2. Moderate centrilobular emphysema with diffuse bronchial wall
thickening, suggesting COPD.
3. Mild bibasilar lung scarring.
4. Three-vessel coronary atherosclerosis.

Aortic Atherosclerosis (9K1RV-2MA.A) and Emphysema (9K1RV-A6K.4).

## 2019-03-23 ENCOUNTER — Ambulatory Visit: Payer: Medicare Other | Admitting: Sports Medicine

## 2019-03-26 ENCOUNTER — Ambulatory Visit: Payer: Medicare Other | Admitting: Podiatry

## 2019-05-01 ENCOUNTER — Other Ambulatory Visit: Payer: Self-pay | Admitting: Physician Assistant

## 2019-05-01 DIAGNOSIS — R350 Frequency of micturition: Secondary | ICD-10-CM

## 2019-05-01 DIAGNOSIS — N401 Enlarged prostate with lower urinary tract symptoms: Secondary | ICD-10-CM

## 2019-05-07 ENCOUNTER — Other Ambulatory Visit: Payer: Self-pay

## 2019-05-07 DIAGNOSIS — I2699 Other pulmonary embolism without acute cor pulmonale: Secondary | ICD-10-CM

## 2019-05-07 DIAGNOSIS — I6300 Cerebral infarction due to thrombosis of unspecified precerebral artery: Secondary | ICD-10-CM

## 2019-05-07 MED ORDER — APIXABAN 5 MG PO TABS: 5.0000 mg | ORAL_TABLET | Freq: Two times a day (BID) | ORAL | 0 refills | Status: DC

## 2019-05-12 ENCOUNTER — Ambulatory Visit (INDEPENDENT_AMBULATORY_CARE_PROVIDER_SITE_OTHER): Payer: Medicare Other | Admitting: Physician Assistant

## 2019-05-12 ENCOUNTER — Ambulatory Visit: Payer: Medicare Other | Admitting: Physician Assistant

## 2019-05-12 ENCOUNTER — Encounter: Payer: Self-pay | Admitting: Physician Assistant

## 2019-05-12 VITALS — BP 128/72 | HR 70 | Temp 97.5°F | Ht 66.0 in

## 2019-05-12 DIAGNOSIS — N183 Chronic kidney disease, stage 3 unspecified: Secondary | ICD-10-CM

## 2019-05-12 DIAGNOSIS — I10 Essential (primary) hypertension: Secondary | ICD-10-CM | POA: Diagnosis not present

## 2019-05-12 DIAGNOSIS — E871 Hypo-osmolality and hyponatremia: Secondary | ICD-10-CM

## 2019-05-12 DIAGNOSIS — I6521 Occlusion and stenosis of right carotid artery: Secondary | ICD-10-CM

## 2019-05-12 DIAGNOSIS — G4710 Hypersomnia, unspecified: Secondary | ICD-10-CM | POA: Diagnosis not present

## 2019-05-12 DIAGNOSIS — R5383 Other fatigue: Secondary | ICD-10-CM

## 2019-05-12 NOTE — Progress Notes (Deleted)
Patient has had increased fatigue for past several months, looked up side effects of eliquis, and concerned that it is due to medication. Sleep - bedtime around 9pm, wakes up around 11am. Takes naps off and on all day. Does wake up at night to go to the bathroom, but will go right back to sleep.

## 2019-05-12 NOTE — Progress Notes (Deleted)
   Subjective:    Patient ID: Mark Mora, male    DOB: 1920/05/19, 85 y.o.   MRN: [id number]  HPI flomax Sleepiness  stuggl   Review of Systems     Objective:   Physical Exam        Assessment & Plan:

## 2019-05-15 DIAGNOSIS — R5383 Other fatigue: Secondary | ICD-10-CM | POA: Insufficient documentation

## 2019-05-15 DIAGNOSIS — G4710 Hypersomnia, unspecified: Secondary | ICD-10-CM | POA: Insufficient documentation

## 2019-05-15 NOTE — Progress Notes (Signed)
Call pt: sodium is good! Thyroid is great. You are a little anemic. Any black tarry stools? Any abdominal pain? Do you take any iron?  Your kidney function is up a little too. Make sure staying hydrated and avoiding any oral ibuprofen/advil/aleve.

## 2019-05-15 NOTE — Progress Notes (Signed)
Patient ID: Mark Mora, male   DOB: Jun 27, 1920, 85 y.o.   MRN: [id number] .Marland KitchenVirtual Visit via Telephone Note  I connected with Zaquan Duffner on 05/15/19 at  2:40 PM EDT by telephone and verified that I am speaking with the correct person using two identifiers.  Location: Patient: home Provider: home   I discussed the limitations, risks, security and privacy concerns of performing an evaluation and management service by telephone and the availability of in person appointments. I also discussed with the patient that there may be a patient responsible charge related to this service. The patient expressed understanding and agreed to proceed.   History of Present Illness: Pt is a 85 yo male with HTN, hx of PE and CVA, known carotid atherosclerosis, GERD, hx of hyponatremia who calls into the clinic to discuss fatigue. He is accompanied by his daughter on the phone who helps with the history.   Pt feels like he is sleeping more than he should. For the last 3-6 months he is tired most of the time. He sleeps well at night but if he sits down he can nap. He has noticed it is worse during COVID-19 pandemic where he has been quarantined. Denies any depression but feels like his mood is depressed some. No changes in medication. He read the back of eliquis bottle and say it could cause fatigue and wanted to see what I thought. He has been on eliqus since 06/2018.   .. Active Ambulatory Problems    Diagnosis Date Noted  . CKD (chronic kidney disease) stage 3, GFR 30-59 ml/min (HCC) 11/05/2017  . 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 Essential hypertension 11/05/2017  . Carotid atherosclerosis, right 11/05/2017  . Pure hypercholesterolemia 11/05/2017  . Screening for diabetes mellitus 11/05/2017  . Hearing loss due to cerumen impaction, bilateral 11/05/2017  . Ingrown toenail of left foot 03/20/2018  . Toenail fungus 03/20/2018  . Hearing loss of left ear due to cerumen  impaction 03/20/2018  . Gastroesophageal reflux disease with esophagitis 03/23/2018  . Benign prostatic hyperplasia with urinary frequency 03/23/2018  . Neuropathy 03/23/2018  . History of prostate cancer 05/05/2018  . Numbness and tingling of both feet 05/05/2018  . Prostate cancer ([place]) 05/23/2018  . Weight loss 05/23/2018  . Spinal stenosis of lumbar region 05/23/2018  . Pulmonary embolus ([place]) 07/15/2018  . Cerebrovascular accident (CVA) due to thrombosis of precerebral artery ([place]) 07/15/2018  . Paresthesias 07/16/2018  . Hypotension 07/16/2018  . Slow transit constipation 07/26/2018  . Bilateral upper abdominal discomfort 07/26/2018  . Burping 07/26/2018  . Opacity of lung on imaging study 07/26/2018  . Centrilobular emphysema ([place]) 08/15/2018  . Hyponatremia 08/15/2018  . Dystrophic nail 09/10/2018  . Bilateral edema of lower extremity 09/10/2018  . Chronic venous stasis dermatitis of both lower extremities 09/10/2018  . BPH (benign prostatic hyperplasia) 09/26/2018  . Degenerative joint disease of cervical spine 09/26/2018  . Elevated troponin 07/04/2018  . GERD (gastroesophageal reflux disease) 09/26/2018  . Hyperlipemia 09/26/2018  . Hyperglycemia 07/04/2018  . Bilateral impacted cerumen 11/23/2018  . Chronic venous stasis 01/19/2019  . Chronic right shoulder pain 01/19/2019  . Ganglion cyst of volar aspect of left wrist 02/17/2019  . Fatigue 05/15/2019  . Sleeping excessive 05/15/2019   Resolved Ambulatory Problems    Diagnosis Date Noted  . No Resolved Ambulatory Problems   Past Medical History:  Diagnosis Date  . Hyperlipidemia   . Hypertension   . Stroke [facility])  Reviewed med, allergy, problem list.     Observations/Objective: No acute distress. Normal breathing.  Normal mood.   .. Today's Vitals   05/12/19 1318  BP: 128/72  Pulse: 70  Temp: (!) 97.5 F (36.4 C)  TempSrc: Oral  Height: 5\' 6"  (1.676 m)   Body mass index is 21.79  kg/m.  .. Depression screen [facility] 2/9 11/10/2018 11/05/2017 11/05/2017  Decreased Interest 0 0 0  Down, Depressed, Hopeless 0 0 0  PHQ - 2 Score 0 0 0  Altered sleeping - 0 -  Tired, decreased energy - 1 -  Change in appetite - 0 -  Feeling bad or failure about yourself  - 0 -  Trouble concentrating - 0 -  Moving slowly or fidgety/restless - 0 -  Suicidal thoughts - 0 -  PHQ-9 Score - 1 -  Difficult doing work/chores - Not difficult at all -     Assessment and Plan: .Marland KitchenHutchinson was seen today for medication problem.  Diagnoses and all orders for this visit:  Fatigue, unspecified type -     COMPLETE METABOLIC PANEL WITH GFR -     CBC w/Diff/Platelet -     TSH -     Cortisol, free, Serum  Sleeping excessive -     COMPLETE METABOLIC PANEL WITH GFR -     CBC w/Diff/Platelet -     TSH -     Cortisol, free, Serum  Essential hypertension  Carotid atherosclerosis, right  CKD (chronic kidney disease) stage 3, GFR 30-59 ml/min (HCC) -     COMPLETE METABOLIC PANEL WITH GFR  Hyponatremia -     COMPLETE METABOLIC PANEL WITH GFR   Discussed with patient I do not think eliqus is causing his sleepiness since he has been on for so long. Also their is risk vs benefit. I do not think he should come off eliquis as is known CAD and PE would increase risk of CV event.   Lets do some labs. He does have hx of hyponatremia which can cause fatigue. Will check CBC. Certainly mood can effect fatigue. He has been quarantined. Start going out in safe ways. Make sure to go outside a few times a day. Also due to age sleepiness can be a normal cycle. Ok to sleep and nap. Don't feel bad for it. Try to plan activities during the day to do and then nap.     Follow Up Instructions:    I discussed the assessment and treatment plan with the patient. The patient was provided an opportunity to ask questions and all were answered. The patient agreed with the plan and demonstrated an understanding of the  instructions.   The patient was advised to call back or seek an in-person evaluation if the symptoms worsen or if the condition fails to improve as anticipated.  I provided 18 minutes of non-face-to-face time during this encounter.   Iran Planas, PA-C

## 2019-05-18 ENCOUNTER — Encounter: Payer: Self-pay | Admitting: Physician Assistant

## 2019-05-21 LAB — CBC WITH DIFFERENTIAL/PLATELET
Absolute Monocytes: 762 cells/uL (ref 200–950)
Basophils Absolute: 37 cells/uL (ref 0–200)
Basophils Relative: 0.5 %
Eosinophils Absolute: 59 cells/uL (ref 15–500)
Eosinophils Relative: 0.8 %
HCT: 36.6 % — ABNORMAL LOW (ref 38.5–50.0)
Hemoglobin: 12.1 g/dL — ABNORMAL LOW (ref 13.2–17.1)
Lymphs Abs: 644 cells/uL — ABNORMAL LOW (ref 850–3900)
MCH: 31.1 pg (ref 27.0–33.0)
MCHC: 33.1 g/dL (ref 32.0–36.0)
MCV: 94.1 fL (ref 80.0–100.0)
MPV: 11.3 fL (ref 7.5–12.5)
Monocytes Relative: 10.3 %
Neutro Abs: 5898 cells/uL (ref 1500–7800)
Neutrophils Relative %: 79.7 %
Platelets: 205 10*3/uL (ref 140–400)
RBC: 3.89 10*6/uL — ABNORMAL LOW (ref 4.20–5.80)
RDW: 11.9 % (ref 11.0–15.0)
Total Lymphocyte: 8.7 %
WBC: 7.4 10*3/uL (ref 3.8–10.8)

## 2019-05-21 LAB — TSH: TSH: 1.73 mIU/L (ref 0.40–4.50)

## 2019-05-21 LAB — COMPLETE METABOLIC PANEL WITH GFR
AG Ratio: 1.5 (calc) (ref 1.0–2.5)
ALT: 11 U/L (ref 9–46)
AST: 16 U/L (ref 10–35)
Albumin: 3.8 g/dL (ref 3.6–5.1)
Alkaline phosphatase (APISO): 50 U/L (ref 35–144)
BUN/Creatinine Ratio: 22 (calc) (ref 6–22)
BUN: 35 mg/dL — ABNORMAL HIGH (ref 7–25)
CO2: 26 mmol/L (ref 20–32)
Calcium: 9.6 mg/dL (ref 8.6–10.3)
Chloride: 102 mmol/L (ref 98–110)
Creat: 1.58 mg/dL — ABNORMAL HIGH (ref 0.70–1.11)
GFR, Est African American: 42 mL/min/{1.73_m2} — ABNORMAL LOW (ref >=60)
GFR, Est Non African American: 36 mL/min/{1.73_m2} — ABNORMAL LOW (ref >=60)
Globulin: 2.6 g/dL (calc) (ref 1.9–3.7)
Glucose, Bld: 90 mg/dL (ref 65–99)
Potassium: 5.3 mmol/L (ref 3.5–5.3)
Sodium: 135 mmol/L (ref 135–146)
Total Bilirubin: 0.6 mg/dL (ref 0.2–1.2)
Total Protein: 6.4 g/dL (ref 6.1–8.1)

## 2019-05-21 LAB — CORTISOL, FREE: Cortisol Free, Ser: 0.68 ug/dL

## 2019-05-25 NOTE — Progress Notes (Signed)
At this point just increase iron rich foods. If not having any symptoms of blood loss no need to have further work up. She could start giving him iron supplement with breakfast and see if made him feel a little better overall.

## 2019-06-17 ENCOUNTER — Other Ambulatory Visit: Payer: Self-pay | Admitting: Physician Assistant

## 2019-06-17 ENCOUNTER — Other Ambulatory Visit: Payer: Self-pay | Admitting: *Deleted

## 2019-06-17 DIAGNOSIS — I10 Essential (primary) hypertension: Secondary | ICD-10-CM

## 2019-06-17 DIAGNOSIS — I6521 Occlusion and stenosis of right carotid artery: Secondary | ICD-10-CM

## 2019-06-17 DIAGNOSIS — E7800 Pure hypercholesterolemia, unspecified: Secondary | ICD-10-CM

## 2019-06-17 MED ORDER — FAMOTIDINE 20 MG PO TABS: 20.0000 mg | ORAL_TABLET | Freq: Two times a day (BID) | ORAL | 1 refills | Status: DC

## 2019-06-18 ENCOUNTER — Ambulatory Visit (INDEPENDENT_AMBULATORY_CARE_PROVIDER_SITE_OTHER): Payer: Medicare Other | Admitting: Family Medicine

## 2019-06-18 ENCOUNTER — Encounter: Payer: Self-pay | Admitting: Family Medicine

## 2019-06-18 VITALS — BP 128/76 | HR 96 | Temp 98.2°F | Ht 65.0 in | Wt 133.0 lb

## 2019-06-18 DIAGNOSIS — B351 Tinea unguium: Secondary | ICD-10-CM

## 2019-06-18 DIAGNOSIS — E1169 Type 2 diabetes mellitus with other specified complication: Secondary | ICD-10-CM

## 2019-06-18 MED ORDER — TERBINAFINE HCL 250 MG PO TABS: 250.0000 mg | ORAL_TABLET | Freq: Every day | ORAL | 0 refills | Status: DC

## 2019-06-18 NOTE — Progress Notes (Signed)
Mark Mora is a 85 y.o. male who presents to [place]: [place] today for evaluation of fungal infections in multiple toenails. Patient has had yellow and misshapen toenails for a couple years. Patient also complains of aching pain in great toenails. Patient has seen multiple podiatrists. They cut the nails back and prescribed anti-fungal creams but nothing has worked.  He like definitive treatment if able is is is sometimes uncomfortable.  He notes the left great toenail is lifting up a bit and somewhat obnoxious.   ROS as above:  Exam:  BP 128/76   Pulse 96   Temp 98.2 F (36.8 C) (Oral)   Ht 5\' 5"  (1.651 m)   Wt 133 lb (60.3 kg)   SpO2 96%   BMI 22.13 kg/m  Wt Readings from Last 5 Encounters:  06/18/19 133 lb (60.3 kg)  02/17/19 135 lb (61.2 kg)  01/12/19 138 lb (62.6 kg)  12/15/18 131 lb (59.4 kg)  11/17/18 130 lb (59 kg)    Gen: Well NAD HEENT: EOMI,  MMM Lungs: Normal work of breathing. CTABL Heart: RRR no MRG Abd: NABS, Soft. Nondistended, Nontender Exts: All 10 toenails yellow and misshapen. Right great toenail thickened. Left great toenail thickened and slightly elevated. Brisk capillary refill, warm, moderate venous stasis. 2+ pedal edema. 2+ dorsalis pedis pulses BL.   Nails trimmed: The right great toenail which is quite thick was trimmed down to decrease the bulk.  No bleeding. The left great toenail which is slightly lifted up was trimmed back slightly.  Again no bleeding.  Lab and Radiology Results   Chemistry      Component Value Date/Time   NA 135 05/14/2019 1052   K 5.3 05/14/2019 1052   CL 102 05/14/2019 1052   CO2 26 05/14/2019 1052   BUN 35 (H) 05/14/2019 1052   CREATININE 1.58 (H) 05/14/2019 1052      Component Value Date/Time   CALCIUM 9.6 05/14/2019 1052   AST 16 05/14/2019 1052   ALT 11 05/14/2019 1052   BILITOT 0.6  05/14/2019 1052        Assessment and Plan: 85 y.o. male with complicated medical Hx presents with fungal infections in all 10 toenails. Fungus has been resistant to anti-fungal ointments. Prescribed Terbinafine for initial 25-month course.  Will likely need about 9 months total treatment.. CMP done on 05/14/2019 showed good liver function. Recheck liver functions in 3 months and every 3 months until treatment completed.  PDMP not reviewed this encounter. No orders of the defined types were placed in this encounter.  Meds ordered this encounter  Medications  . terbinafine (LAMISIL) 250 MG tablet    Sig: Take 1 tablet (250 mg total) by mouth daily.    Dispense:  90 tablet    Refill:  0   I spent 25 minutes with this patient, greater than 50% was face-to-face time counseling regarding toenail care.   Historical information moved to improve visibility of documentation.  Past Medical History:  Diagnosis Date  . Carotid atherosclerosis, right 11/05/2017  . CKD (chronic kidney disease) stage 3, GFR 30-59 ml/min (HCC) 11/05/2017  . Essential hypertension 11/05/2017  . Hearing loss due to cerumen impaction, bilateral 11/05/2017  . Hyperlipidemia   . Hypertension   . Pure hypercholesterolemia 11/05/2017  . Screening for diabetes mellitus 11/05/2017  . Stroke ([place])   . 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 HERNIA REPAIR     Social History   Tobacco Use  . Smoking status: Former Smoker    Packs/day: 0.25    Years: 6.00    Pack years: 1.50    Types: Cigarettes  . Smokeless tobacco: Never Used  . Tobacco comment: quit age 56  Substance Use Topics  . Alcohol use: No    Frequency: Never   family history is not on file.  Medications: Current Outpatient Medications  Medication Sig Dispense Refill  . acetaminophen (TYLENOL) 650 MG CR tablet Take by mouth.    Marland Kitchen amLODipine (NORVASC) 2.5 MG tablet  Take 1 tablet (2.5 mg total) by mouth daily. 90 tablet 1  . apixaban (ELIQUIS) 5 MG TABS tablet Take 1 tablet (5 mg total) by mouth 2 (two) times daily. 60 tablet 0  . atorvastatin (LIPITOR) 40 MG tablet Take 1 tablet (40 mg total) by mouth daily. Needs lab work 90 tablet 1  . Cholecalciferol (VITAMIN D3) 2000 units TABS Take by mouth.    . cloNIDine (CATAPRES) 0.1 MG tablet TAKE 1 TABLET BY MOUTH EVERY 6 HOURS AS NEEDED (SBP 170 OR DBP 100) FOR UP TO 30 DAYS.  0  . famotidine (PEPCID) 20 MG tablet Take 1 tablet (20 mg total) by mouth 2 (two) times daily. 180 tablet 1  . fluticasone (FLONASE) 50 MCG/ACT nasal spray 1 spray by Both Nostrils route daily. 16 g 2  . furosemide (LASIX) 20 MG tablet TAKE 1 TABLET BY MOUTH  DAILY 90 tablet 0  . ipratropium (ATROVENT) 0.06 % nasal spray Place 2 sprays into both nostrils 4 (four) times daily as needed for rhinitis. 15 mL 0  . lisinopril (ZESTRIL) 20 MG tablet TAKE 1 TABLET BY MOUTH  DAILY 90 tablet 1  . tamsulosin (FLOMAX) 0.4 MG CAPS capsule TAKE 1 CAPSULE BY MOUTH  DAILY 90 capsule 1  . terbinafine (LAMISIL) 250 MG tablet Take 1 tablet (250 mg total) by mouth daily. 90 tablet 0   No current facility-administered medications for this visit.    No Known Allergies   Discussed warning signs or symptoms. Please see discharge instructions. Patient expresses understanding.  I personally was present and performed or re-performed the history, physical exam and medical decision-making activities of this service and have verified that the service and findings are accurately documented in the student's note. ___________________________________________ Lynne Leader M.D., ABFM., CAQSM. Primary Care and Sports Medicine Adjunct Instructor of [employer]

## 2019-06-18 NOTE — Patient Instructions (Signed)
Thank you for coming in today. Take terbinafine daily.  Check with Jade in 3 months.  Return sooner if needed.  Keep me updated if any issues.   Onychomycosis/Fungal Toenails  WHAT IS IT? An infection that lies within the keratin of your nail plate that is caused by a fungus.  WHY ME? Fungal infections affect all ages, sexes, races, and creeds.  There may be many factors that predispose you to a fungal infection such as age, coexisting medical conditions such as diabetes, or an autoimmune disease; stress, medications, fatigue, genetics, etc.  Bottom line: fungus thrives in a warm, moist environment and your shoes offer such a location.  IS IT CONTAGIOUS? Theoretically, yes.  You do not want to share shoes, nail clippers or files with someone who has fungal toenails.  Walking around barefoot in the same room or sleeping in the same bed is unlikely to transfer the organism.  It is important to realize, however, that fungus can spread easily from one nail to the next on the same foot.  HOW DO WE TREAT THIS?  There are several ways to treat this condition.  Treatment may depend on many factors such as age, medications, pregnancy, liver and kidney conditions, etc.  It is best to ask your doctor which options are available to you.  1. No treatment.   Unlike many other medical concerns, you can live with this condition.  However for many people this can be a painful condition and may lead to ingrown toenails or a bacterial infection.  It is recommended that you keep the nails cut short to help reduce the amount of fungal nail. 2. Topical treatment.  These range from herbal remedies to prescription strength nail lacquers.  About 40-50% effective, topicals require twice daily application for approximately 9 to 12 months or until an entirely new nail has grown out.  The most effective topicals are medical grade medications available through physicians offices. 3. Oral antifungal medications.  With an 80-90%  cure rate, the most common oral medication requires 3 to 4 months of therapy and stays in your system for a year as the new nail grows out.  Oral antifungal medications do require blood work to make sure it is a safe drug for you.  A liver function panel will be performed prior to starting the medication and after the first month of treatment.  It is important to have the blood work performed to avoid any harmful side effects.  In general, this medication safe but blood work is required. 4. Laser Therapy.  This treatment is performed by applying a specialized laser to the affected nail plate.  This therapy is noninvasive, fast, and non-painful.  It is not covered by insurance and is therefore, out of pocket.  The results have been very good with a 80-95% cure rate.  The [facility] is the only practice in the area to offer this therapy. 5. Permanent Nail Avulsion.  Removing the entire nail so that a new nail will not grow back.

## 2019-06-24 ENCOUNTER — Ambulatory Visit (INDEPENDENT_AMBULATORY_CARE_PROVIDER_SITE_OTHER): Payer: Medicare Other | Admitting: Family Medicine

## 2019-06-24 ENCOUNTER — Other Ambulatory Visit: Payer: Self-pay

## 2019-06-24 ENCOUNTER — Ambulatory Visit (INDEPENDENT_AMBULATORY_CARE_PROVIDER_SITE_OTHER): Payer: Medicare Other

## 2019-06-24 VITALS — BP 121/50 | HR 96 | Temp 98.5°F | Wt 137.0 lb

## 2019-06-24 DIAGNOSIS — C61 Malignant neoplasm of prostate: Secondary | ICD-10-CM

## 2019-06-24 DIAGNOSIS — D649 Anemia, unspecified: Secondary | ICD-10-CM | POA: Diagnosis not present

## 2019-06-24 DIAGNOSIS — R29898 Other symptoms and signs involving the musculoskeletal system: Secondary | ICD-10-CM | POA: Diagnosis not present

## 2019-06-24 DIAGNOSIS — M48061 Spinal stenosis, lumbar region without neurogenic claudication: Secondary | ICD-10-CM | POA: Diagnosis not present

## 2019-06-24 MED ORDER — DICLOFENAC SODIUM 1 % TD GEL: 4.0000 g | Freq: Four times a day (QID) | TRANSDERMAL | 11 refills | Status: DC

## 2019-06-24 NOTE — Patient Instructions (Signed)
Thank you for coming in today. Get labs now.  Get xray now.  You should hear about home health physical therapy.  Let me know which company you used.  Use the diclofenac gel up to 4x daily for pain as needed.  Also consider a heating pad.   Recheck with me in 4 weeks.  Return sooner if needed.

## 2019-06-24 NOTE — Progress Notes (Signed)
Mark Mora is a 85 y.o. male who presents to [facility] today for back pain and leg pain.  Mark Mora has a history of chronic low back pain.  This is been ongoing since the 9s.  He had an evaluation and was found to have lumbar spinal stenosis.  He did pretty well over the years.  However recently he notes the back pain is worsening and he is having pain radiating to his legs bilaterally.  He notes pain mostly in the lateral calf.  Sometimes he feels as though his legs get a bit weak especially when he stands.  He notes standing also worsens his back pain.  He feels better with flexion position and sitting.  He is tried Tylenol which helps some.  He has had some home health physical therapy in the past after a stroke and is interested in doing that again.  He is worried that if his legs get weak he might not be able to walk.  He has a pertinent medical history for prostate cancer.  His PSA has been in the 30s and after evaluation with urology it is being managed with watchful waiting.  He had a big work-up in June 2019 with CT scan and nuclear medicine bone scan that did not show any metastatic bone lesions.    ROS:  As above  Exam:  BP (!) 121/50   Pulse 96   Temp 98.5 F (36.9 C) (Oral)   Wt 137 lb (62.1 kg)   BMI 22.80 kg/m   Wt Readings from Last 5 Encounters:  06/25/19 137 lb (62.1 kg)  06/18/19 133 lb (60.3 kg)  02/17/19 135 lb (61.2 kg)  01/12/19 138 lb (62.6 kg)  12/15/18 131 lb (59.4 kg)   General: Well Developed, well nourished, and in no acute distress.  Neuro/Psych: Alert and oriented x3, extra-ocular muscles intact, able to move all 4 extremities, sensation grossly intact. Skin: Warm and dry, no rashes noted.  Respiratory: Not using accessory muscles, speaking in full sentences, trachea midline.  Cardiovascular: Pulses palpable, no extremity edema. Abdomen: Does not appear distended. MSK:  L-spine: Nontender to spinal  midline. Tender palpation lumbar paraspinal musculature. Normal flexion decreased extension. Lower semi-strength reflexes and sensation are equal normal throughout.    Lab and Radiology Results  X-ray images L-spine obtained 06/24/2019 personally and independently reviewed. Severe degenerative changes with mild degenerative scoliosis.  No obvious fracture or lytic bone lesion. Await formal radiology over read  CT Abdomen Pelvis WO W IV Contrast6/13/2019 [facility] Result Impression  IMPRESSION: 1. No definite evidence of metastatic disease. 2. Fecal retention. 3. Right simple and minimally complex renal cysts.    Electronically Signed by: Mickeal Skinner  Result Narrative  CT abdomen and pelvis, without and with IV contrast  COMPARISON:None.  INDICATION:Neoplasm: prostate, recurrence, suspected/known.  Technique:CT imaging was performed of the abdomen and pelvis without and with IV contrast. The patient received intravenous contrast (Iso-Vue 370, 50 mL).A genitourinary protocol CT was performed, with noncontrast imaging, a nephrographic phase, and  excretory phase imaging from the kidneys through the bladder. Coronal and sagittal reformatted images were provided. Radiation dose reduction was utilized (automated exposure control, mA or kV adjustment based on patient size, or iterative image  reconstruction).  FINDINGS:    Right kidney: No renal or ureteral calculi. No hydronephrosis. Right inferior pole simple cyst measuring 51 x 63 mm. Right upper pole cyst is simple in attenuation however has peripheral calcifications. This measures 55  x 29 mm. No solid renal mass. No  filling defects in the opacified portions of the collecting system or ureter.  Left kidney: No renal or ureteral calculi. No hydronephrosis. No focal renal lesions. No filling defects in the opacified portions of the collecting system or ureter.  Bladder: Unremarkable.  Lung bases: Peripheral  predominant micronodules, likely chronic. Heart is normal in size without pericardial effusion. Coronary artery calcifications. Mitral annulus calcifications. Aortic valve calcifications.  Liver/Biliary: Liver is normal in size and morphology. Subcentimeter hypodensity near the right hepatic dome is too small adequately characterize. Gallbladder is unremarkable. No biliary ductal dilatation. Portal veins are patent.  Spleen: Normal size spleen.   Pancreas: Unremarkable.  Adrenals: No adrenal nodule.   Bowel: No bowel obstruction. Fecal retention with stool within the colon and also multiple loops of fecalized small bowel.  Lymphadenopathy: No pathologic lymphadenopathy.   Free Fluid: Trace free fluid in the pelvis.  Vascular: Diffuse bulky atherosclerotic calcifications. These also involve the origins of the mesenteric vessels. No abdominal aortic aneurysm. Ectatic iliac arteries. Conventional venous drainage.  Reproductive: Prostatomegaly. Small left fat-containing inguinal hernia.  Bones/Soft tissues: No acute fracture or aggressive osseous lesion. Advanced multilevel degenerative changes. Osteopenia. Scoliosis. No acute soft tissue findings    NM Bone Scan Whole Body6/13/2019 [facility] Result Impression  IMPRESSION: No evidence for metastatic disease.   Electronically Signed by: Ruta Hinds  Result Narrative  INDICATION: Prostate cancer  COMPARISON: CT 06/21/2018  TECHNIQUE: Delayed whole-body images following intravenous administration of 25.4 mCi technetium 39m MDP.  FINDINGS: No evidence for metastatic disease. There is degenerative uptake in the shoulders, hands and left ankle. Kyphoscoliosis. Renal uptake and excretion. Photopenic defects in the right     Assessment and Plan: 85 y.o. male with  Lumbar pain with lumbar radiculopathy likely L5 nerve roots bilaterally.  Patient has significant spinal stenosis which likely is the main root of his problem.   Additionally he also has prostate cancer that is been smoldering along but not rapidly changing.  Per my read x-ray shows significant degenerative changes however I am waiting on radiology over read.  Main reason for x-ray was to double check for possible compression fracture or metastatic changes.  Additionally will check labs listed below to follow along PSA.  Since were getting labs we will check CBC and iron stores as he has had a history of anemia in the past and metabolic panel to follow along his CKD.  As for treatment plan for trial of diclofenac gel and referral to home health physical therapy.  This should hopefully help with his back pain and improve his leg strength without being very obnoxious.  If not improving neck step would be likely MRI for potential epidural steroid injection planning.   Recheck in 1 month.   PDMP not reviewed this encounter. Orders Placed This Encounter  Procedures  . DG Lumbar Spine Complete    Standing Status:   Future    Number of Occurrences:   1    Standing Expiration Date:   08/24/2020    Order Specific Question:   Reason for Exam (SYMPTOM  OR DIAGNOSIS REQUIRED)    Answer:   eval spinal stenosis and BL L5 radicuopathy and eval for mets. Hx prostate cancer    Order Specific Question:   Preferred imaging location?    Answer:   [facility]    Order Specific Question:   Radiology Contrast Protocol - do NOT remove file path    Answer:   \\  [email or web address]  . CBC  . COMPLETE METABOLIC PANEL WITH GFR  . PSA  . Iron, TIBC and Ferritin Panel  . Ambulatory referral to Home Health    Referral Priority:   Routine    Referral Type:   Home Health Care    Referral Reason:   Specialty Services Required    Requested Specialty:   [place]    Number of Visits Requested:   1   Meds ordered this encounter  Medications  . diclofenac sodium (VOLTAREN) 1 % GEL    Sig: Apply 4 g topically 4 (four)  times daily. To affected joint.    Dispense:  100 g    Refill:  11    Historical information moved to improve visibility of documentation.  Past Medical History:  Diagnosis Date  . Carotid atherosclerosis, right 11/05/2017  . CKD (chronic kidney disease) stage 3, GFR 30-59 ml/min (HCC) 11/05/2017  . Essential hypertension 11/05/2017  . Hearing loss due to cerumen impaction, bilateral 11/05/2017  . Hyperlipidemia   . Hypertension   . Pure hypercholesterolemia 11/05/2017  . Screening for diabetes mellitus 11/05/2017  . Stroke ([place])   . 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 HERNIA REPAIR     Social History   Tobacco Use  . Smoking status: Former Smoker    Packs/day: 0.25    Years: 6.00    Pack years: 1.50    Types: Cigarettes  . Smokeless tobacco: Never Used  . Tobacco comment: quit age 54  Substance Use Topics  . Alcohol use: No    Frequency: Never   family history is not on file.  Medications: Current Outpatient Medications  Medication Sig Dispense Refill  . acetaminophen (TYLENOL) 650 MG CR tablet Take by mouth.    Marland Kitchen amLODipine (NORVASC) 2.5 MG tablet TAKE 1 TABLET BY MOUTH  DAILY 90 tablet 3  . apixaban (ELIQUIS) 5 MG TABS tablet Take 1 tablet (5 mg total) by mouth 2 (two) times daily. 60 tablet 0  . atorvastatin (LIPITOR) 40 MG tablet Take 1 tablet (40 mg total) by mouth daily. Needs lab work 90 tablet 1  . Cholecalciferol (VITAMIN D3) 2000 units TABS Take by mouth.    . cloNIDine (CATAPRES) 0.1 MG tablet TAKE 1 TABLET BY MOUTH EVERY 6 HOURS AS NEEDED (SBP 170 OR DBP 100) FOR UP TO 30 DAYS.  0  . diclofenac sodium (VOLTAREN) 1 % GEL Apply 4 g topically 4 (four) times daily. To affected joint. 100 g 11  . famotidine (PEPCID) 20 MG tablet Take 1 tablet (20 mg total) by mouth 2 (two) times daily. 180 tablet 1  . fluticasone (FLONASE) 50 MCG/ACT nasal spray 1 spray by Both Nostrils route  daily. 16 g 2  . furosemide (LASIX) 20 MG tablet TAKE 1 TABLET BY MOUTH  DAILY 90 tablet 0  . ipratropium (ATROVENT) 0.06 % nasal spray Place 2 sprays into both nostrils 4 (four) times daily as needed for rhinitis. 15 mL 0  . lisinopril (ZESTRIL) 20 MG tablet TAKE 1 TABLET BY MOUTH  DAILY 90 tablet 1  . tamsulosin (FLOMAX) 0.4 MG CAPS capsule TAKE 1 CAPSULE BY MOUTH  DAILY 90 capsule 1  . terbinafine (LAMISIL) 250 MG tablet Take 1 tablet (250 mg total) by mouth daily. 90 tablet 0   No current facility-administered medications for this visit.    No Known Allergies    Discussed warning  signs or symptoms. Please see discharge instructions. Patient expresses understanding.

## 2019-06-25 ENCOUNTER — Encounter: Payer: Self-pay | Admitting: Family Medicine

## 2019-06-25 LAB — CBC
HCT: 33.8 % — ABNORMAL LOW (ref 38.5–50.0)
Hemoglobin: 11.3 g/dL — ABNORMAL LOW (ref 13.2–17.1)
MCH: 31.3 pg (ref 27.0–33.0)
MCHC: 33.4 g/dL (ref 32.0–36.0)
MCV: 93.6 fL (ref 80.0–100.0)
MPV: 11.2 fL (ref 7.5–12.5)
Platelets: 196 10*3/uL (ref 140–400)
RBC: 3.61 10*6/uL — ABNORMAL LOW (ref 4.20–5.80)
RDW: 11.8 % (ref 11.0–15.0)
WBC: 10.6 10*3/uL (ref 3.8–10.8)

## 2019-06-25 LAB — COMPLETE METABOLIC PANEL WITH GFR
AG Ratio: 1.3 (calc) (ref 1.0–2.5)
ALT: 11 U/L (ref 9–46)
AST: 15 U/L (ref 10–35)
Albumin: 3.4 g/dL — ABNORMAL LOW (ref 3.6–5.1)
Alkaline phosphatase (APISO): 48 U/L (ref 35–144)
BUN/Creatinine Ratio: 20 (calc) (ref 6–22)
BUN: 38 mg/dL — ABNORMAL HIGH (ref 7–25)
CO2: 22 mmol/L (ref 20–32)
Calcium: 8.5 mg/dL — ABNORMAL LOW (ref 8.6–10.3)
Chloride: 103 mmol/L (ref 98–110)
Creat: 1.88 mg/dL — ABNORMAL HIGH (ref 0.70–1.11)
GFR, Est African American: 34 mL/min/{1.73_m2} — ABNORMAL LOW (ref >=60)
GFR, Est Non African American: 29 mL/min/{1.73_m2} — ABNORMAL LOW (ref >=60)
Globulin: 2.7 g/dL (calc) (ref 1.9–3.7)
Glucose, Bld: 104 mg/dL — ABNORMAL HIGH (ref 65–99)
Potassium: 4.9 mmol/L (ref 3.5–5.3)
Sodium: 133 mmol/L — ABNORMAL LOW (ref 135–146)
Total Bilirubin: 0.3 mg/dL (ref 0.2–1.2)
Total Protein: 6.1 g/dL (ref 6.1–8.1)

## 2019-06-25 LAB — IRON,TIBC AND FERRITIN PANEL
%SAT: 11 % (calc) — ABNORMAL LOW (ref 20–48)
Ferritin: 243 ng/mL (ref 24–380)
Iron: 25 ug/dL — ABNORMAL LOW (ref 50–180)
TIBC: 218 mcg/dL (calc) — ABNORMAL LOW (ref 250–425)

## 2019-06-25 LAB — PSA: PSA: 37.1 ng/mL — ABNORMAL HIGH (ref <=4.0)

## 2019-06-26 ENCOUNTER — Telehealth: Payer: Self-pay | Admitting: Family Medicine

## 2019-06-26 NOTE — Telephone Encounter (Signed)
Reasonable to take iron pills for 1 month and then will recheck iron we will recheck labs next month.

## 2019-06-26 NOTE — Telephone Encounter (Signed)
-----   Message from Towana Badger, [place] sent at 06/25/2019  2:33 PM EDT ----- Pt's daughter advised of results. States he just re-started his iron pills last week- has missed several doses. Pt's daughter wants to just make sur that pt takes iron like he is supposed to for a month and then recheck levels before proceeding with infusion.

## 2019-07-13 ENCOUNTER — Telehealth: Payer: Self-pay | Admitting: Physician Assistant

## 2019-07-13 NOTE — Telephone Encounter (Signed)
Black stools are normal when taking iron supplements.

## 2019-07-13 NOTE — Telephone Encounter (Signed)
Patient calls and states that he has had black stools x 3-4 days. Is taking iron x 1 month. Complains of no other symptom. Please advise. KG LPN

## 2019-07-13 NOTE — Telephone Encounter (Signed)
Patient and daughter notified. KG LPN

## 2019-07-15 ENCOUNTER — Other Ambulatory Visit: Payer: Self-pay | Admitting: Physician Assistant

## 2019-07-15 DIAGNOSIS — I10 Essential (primary) hypertension: Secondary | ICD-10-CM

## 2019-07-17 ENCOUNTER — Telehealth: Payer: Self-pay

## 2019-07-17 LAB — BASIC METABOLIC PANEL
BUN: 28 — AB (ref 4–21)
Creatinine: 1.3 (ref 0.6–1.3)
Glucose: 107
Potassium: 5 (ref 3.4–5.3)
Sodium: 134 — AB (ref 137–147)

## 2019-07-17 LAB — HEPATIC FUNCTION PANEL
ALT: 9 — AB (ref 10–40)
AST: 17 (ref 14–40)
Alkaline Phosphatase: 51 (ref 25–125)
Bilirubin, Total: 0.2

## 2019-07-17 NOTE — Telephone Encounter (Signed)
Mark Mora called and states he has tingling in his legs. He states he feels like he did the last time he had a stroke. I advised him to go to the ED. We don't have any openings and he is concerned.

## 2019-07-17 NOTE — Telephone Encounter (Signed)
Agree with plan 

## 2019-07-22 ENCOUNTER — Ambulatory Visit (INDEPENDENT_AMBULATORY_CARE_PROVIDER_SITE_OTHER): Payer: Medicare Other | Admitting: Family Medicine

## 2019-07-22 ENCOUNTER — Other Ambulatory Visit: Payer: Self-pay

## 2019-07-22 ENCOUNTER — Encounter: Payer: Self-pay | Admitting: Family Medicine

## 2019-07-22 VITALS — BP 147/69 | HR 89 | Temp 97.5°F | Wt 134.0 lb

## 2019-07-22 DIAGNOSIS — M48061 Spinal stenosis, lumbar region without neurogenic claudication: Secondary | ICD-10-CM | POA: Diagnosis not present

## 2019-07-22 DIAGNOSIS — N183 Chronic kidney disease, stage 3 unspecified: Secondary | ICD-10-CM

## 2019-07-22 DIAGNOSIS — B351 Tinea unguium: Secondary | ICD-10-CM | POA: Diagnosis not present

## 2019-07-22 DIAGNOSIS — L603 Nail dystrophy: Secondary | ICD-10-CM | POA: Diagnosis not present

## 2019-07-22 MED ORDER — MUPIROCIN 2 % EX OINT: TOPICAL_OINTMENT | CUTANEOUS | 3 refills | Status: DC

## 2019-07-22 NOTE — Patient Instructions (Addendum)
Thank you for coming in today. We could consider a back injection.  You will need to stop the eliquis fo a few days to do the shot.  Try to get th MRI on a CD made.  Use the mupirocin antibiotic ointment on the toe. We will see how it goes.   I am still happy with how things are going. The kidneys and liver look just fine.    Keep me updated and recheck as needed.

## 2019-07-22 NOTE — Progress Notes (Signed)
Mark Mora is a 85 y.o. male who presents to [facility] today for follow-up back pain and discuss onychomycosis and slightly irritated left great toe.  Patient has been seen previously for low back pain and lumbar radiculopathy related to severe lumbar spinal stenosis.  He was doing pretty well at last check however had episode of radiating lower leg tingling and pain and some weakness.  He was seen in the emergency room where he had extensive work-up including lumbar MRI which confirmed severe spinal stenosis.  However he has had significant symptom improvement in the interval week or so.  He notes he still has a little bit of tingling in his legs in an L5 distribution bilaterally however he denies any weakness and notes that it is improving quite a lot.  He does continue to have some back pain which she describes as stable and mild to moderate.  Additionally he has been taking terbinafine now for about a month for onychomycosis.  He is tolerating quite well.  While in the hospital he had lab assessment which showed improved renal function and stable hepatic function.  Additionally he notes that the skin at the distal tip of the left great toe is a little red and a little tender.  He had been prescribed some topical antifungal medication to apply to his toenail as well as using some over-the-counter topical medication has been using on the toenail.  He thinks a week or so ago he got some of it on the skin of his toe which caused some irritation.  He is tried some over-the-counter nystatin cream which did not help.  He thinks his shoes are well fitting.  He notes this is mildly painful when noxious.     ROS:  As above  Exam:  BP (!) 147/69    Pulse 89    Temp (!) 97.5 F (36.4 C) (Oral)    Wt 134 lb (60.8 kg)    BMI 22.30 kg/m  Wt Readings from Last 5 Encounters:  07/22/19 134 lb (60.8 kg)  06/25/19 137 lb (62.1 kg)  06/18/19 133 lb (60.3 kg)   02/17/19 135 lb (61.2 kg)  01/12/19 138 lb (62.6 kg)   General: Well Developed, well nourished, and in no acute distress.  Neuro/Psych: Alert and oriented x3, extra-ocular muscles intact, able to move all 4 extremities, sensation grossly intact. Skin: Warm and dry, no rashes noted.  Respiratory: Not using accessory muscles, speaking in full sentences, trachea midline.  Cardiovascular: Pulses palpable, no extremity edema. Abdomen: Does not appear distended. MSK: L-spine: Nontender to spinal midline. Lower extremity strength is intact. Left foot: Continued present onychomycosis.  Toenails throughout. The left great toe has slight skin erythema distally.  Minimally tender to palpation.  Intact sensation and capillary refill.  No skin breakdown.  Inspection of shoes appear to be well fitted with no foreign bodies in the shoes.  The appear to be appropriate sized.    Lab and Radiology Results Electronically Signed by: Eddie Dibbles  MRI SPINE LUMBAR WO IV CONTRAST  Narrative:  MRI lumbar spine:  INDICATION: Back pain  TECHNIQUE: Sagittal and axial T1 and T2-weighted sequences were performed. Additional sagittal STIR images were performed.  COMPARISON: None  FINDINGS:  There is a dextroscoliosis of the lumbar spine. There are diffuse degenerative changes with diffuse disc desiccation multileveled loss of disc height.  There is no acute fracture.  There is no subluxation.  The conus medullaris is appreciated at  the L1-2 level.  At the T12-L1 level there is no evidence of disc herniation. There is mild ligamental hypertrophy. There is no spinal canal stenosis. There is moderate to severe narrowing of the bilateral neural foramina by spondylitic change.  At the L1-2 level there is broad-based disc bulging with ligamental and facet joint hypertrophy without significant spinal canal stenosis. There is moderate narrowing of the bilateral neural foramina by spondylitic change.  At  the L2-3 level there is a broad-based disc herniation with ligamental and facet joint hypertrophy contributing to moderate stenosis of the spinal canal. There is narrowing of the lateral recess bilaterally. There is mild narrowing of the right and  severe narrowing of the left neural foramina by spondylitic change.  At the L3-4 level there is a broad-based disc herniation with ligamental and facet joint hypertrophy contributing to severe spinal canal stenosis and narrowing of the lateral recess bilaterally. There is severe narrowing of the bilateral neural foramina  by spondylitic change with suspected compromise of the exiting left L3 nerve root.  At the L4-5 level there is a broad-based disc herniation with ligamental and facet joint hypertrophy contributing to moderate to severe spinal canal stenosis. There is narrowing of the lateral recess right greater than left. There is severe narrowing of  the bilateral neural foramina with suspected compromise of the right L4 nerve root.  At L5-S1 there is a broad-based disc herniation with ligamental and facet joint hypertrophy contributing to mild spinal canal stenosis. There is mild narrowing of the lateral recess bilaterally. There is severe narrowing of the bilateral neural foramina  with suspected compromise of the bilateral L5 nerve roots.  The cauda equina appears grossly unremarkable.  There is a large right renal cyst.  Impression:  IMPRESSION:  Moderate to severe narrowing of the bilateral T12-L1 neural foramina by spondylitic change.  Disc bulging at L1-2 without significant spinal canal stenosis. Moderate narrowing of the bilateral neural foramina.  Moderate spinal canal stenosis at L2-3 secondary to disc herniation ligamental and facet joint hypertrophy. Narrowing of the lateral recess bilaterally. Mild narrowing of the right severe narrowing of the left neural foramina.  Severe spinal canal stenosis at L3-4 secondary to disc  herniation ligamental and facet joint hypertrophy. Narrowing of the bilateral lateral recess Severe narrowing of the bilateral neural foramina with suspected compromise of the exiting L3 nerve root.  Moderate to severe spinal canal stenosis at L4-5 secondary to disc herniation ligamental and facet joint hypertrophy. Narrowing of the lateral recess right greater than left. Severe narrowing of the bilateral neural foramina with suspected compromise of  the right L4 nerve root.  Mild spinal canal stenosis at L5-S1 secondary to disc herniation ligamental and facet joint hypertrophy. Mild narrowing of the lateral recess. Severe narrowing of the bilateral neural foramina with suspected compromise of the exiting bilateral L5 nerve  roots.  Electronically Signed by: Erline Levine         Chemistry      Component Value Date/Time   NA 134 (A) 07/17/2019   K 5.0 07/17/2019   CL 103 06/24/2019 1559   CO2 22 06/24/2019 1559   BUN 28 (A) 07/17/2019   CREATININE 1.3 07/17/2019   CREATININE 1.88 (H) 06/24/2019 1559   GLU 107 07/17/2019      Component Value Date/Time   CALCIUM 8.5 (L) 06/24/2019 1559   ALKPHOS 51 07/17/2019   AST 17 07/17/2019   ALT 9 (A) 07/17/2019   BILITOT 0.3 06/24/2019 1559  Assessment and Plan: 85 y.o. male with  Spinal stenosis: Patient has severe spinal stenosis and neuroforaminal stenosis on MRI.  His most dominant symptom is primarily at the L5 nerve root bilaterally.  We discussed his options.  He has had some difficult symptom improvement since his ED visit on the August 7.  At this point the next step if needed would be epidural steroid injection.  However he takes Eliquis and will have to stop Eliquis for several days after the injection to prevent bleeding.  We discussed this and given his risk factors will hold off on doing an injection at this point however that is an option into the future if needed.  His onychomycosis is doing quite well.  Plan to  continue terbinafine.  He will need lab reassessment in about 3 months from now.  Hepatic function is stable on labs obtained on August 7.  As for his toe redness is unclear what the fundamental underlying cause is.  Does not appear to be significantly abnormal today.  Plan to use a trial of topical mupirocin about appointment and recheck as needed.  Patient did have mild elevated creatinine on previous labs.  On lab recheck at ED on August 7 his creatinine had normalized again.  Labs were abstracted and attached to today's note as above.  Recheck back with me as needed.   PDMP not reviewed this encounter. Orders Placed This Encounter  Procedures   Basic metabolic panel    This external order was created through the Results Console.   Hepatic function panel    This external order was created through the Results Console.   Meds ordered this encounter  Medications   mupirocin ointment (BACTROBAN) 2 %    Sig: Apply to affected area TID for 7 days.    Dispense:  30 g    Refill:  3    Historical information moved to improve visibility of documentation.  Past Medical History:  Diagnosis Date   Carotid atherosclerosis, right 11/05/2017   CKD (chronic kidney disease) stage 3, GFR 30-59 ml/min (HCC) 11/05/2017   Essential hypertension 11/05/2017   Hearing loss due to cerumen impaction, bilateral 11/05/2017   Hyperlipidemia    Hypertension    Pure hypercholesterolemia 11/05/2017   Screening for diabetes mellitus 11/05/2017   Stroke ([place])    Varicose veins of right lower extremity with edema 11/05/2017   Past Surgical History:  Procedure Laterality Date   HERNIA REPAIR     Social History   Tobacco Use   Smoking status: Former Smoker    Packs/day: 0.25    Years: 6.00    Pack years: 1.50    Types: Cigarettes   Smokeless tobacco: Never Used   Tobacco comment: quit age 44  Substance Use Topics   Alcohol use: No    Frequency: Never   family history is not on  file.  Medications: Current Outpatient Medications  Medication Sig Dispense Refill   acetaminophen (TYLENOL) 650 MG CR tablet Take by mouth.     amLODipine (NORVASC) 2.5 MG tablet TAKE 1 TABLET BY MOUTH  DAILY 90 tablet 3   apixaban (ELIQUIS) 5 MG TABS tablet Take 1 tablet (5 mg total) by mouth 2 (two) times daily. 60 tablet 0   atorvastatin (LIPITOR) 40 MG tablet Take 1 tablet (40 mg total) by mouth daily. Needs lab work 90 tablet 1   Cholecalciferol (VITAMIN D3) 2000 units TABS Take by mouth.     cloNIDine (CATAPRES) 0.1 MG tablet TAKE  1 TABLET BY MOUTH EVERY 6 HOURS AS NEEDED (SBP 170 OR DBP 100) FOR UP TO 30 DAYS.  0   diclofenac sodium (VOLTAREN) 1 % GEL Apply 4 g topically 4 (four) times daily. To affected joint. 100 g 11   famotidine (PEPCID) 20 MG tablet Take 1 tablet (20 mg total) by mouth 2 (two) times daily. 180 tablet 1   fluticasone (FLONASE) 50 MCG/ACT nasal spray 1 spray by Both Nostrils route daily. 16 g 2   furosemide (LASIX) 20 MG tablet TAKE 1 TABLET BY MOUTH  DAILY 90 tablet 0   ipratropium (ATROVENT) 0.06 % nasal spray Place 2 sprays into both nostrils 4 (four) times daily as needed for rhinitis. 15 mL 0   lisinopril (ZESTRIL) 20 MG tablet TAKE 1 TABLET BY MOUTH  DAILY 90 tablet 1   tamsulosin (FLOMAX) 0.4 MG CAPS capsule TAKE 1 CAPSULE BY MOUTH  DAILY 90 capsule 1   terbinafine (LAMISIL) 250 MG tablet Take 1 tablet (250 mg total) by mouth daily. 90 tablet 0   mupirocin ointment (BACTROBAN) 2 % Apply to affected area TID for 7 days. 30 g 3   No current facility-administered medications for this visit.    No Known Allergies    Discussed warning signs or symptoms. Please see discharge instructions. Patient expresses understanding.

## 2019-08-06 ENCOUNTER — Ambulatory Visit (INDEPENDENT_AMBULATORY_CARE_PROVIDER_SITE_OTHER): Payer: Medicare Other | Admitting: Family Medicine

## 2019-08-06 ENCOUNTER — Other Ambulatory Visit: Payer: Self-pay

## 2019-08-06 ENCOUNTER — Encounter: Payer: Self-pay | Admitting: Family Medicine

## 2019-08-06 ENCOUNTER — Telehealth: Payer: Self-pay | Admitting: Physician Assistant

## 2019-08-06 VITALS — BP 157/73 | HR 108 | Temp 98.3°F | Wt 134.1 lb

## 2019-08-06 DIAGNOSIS — L089 Local infection of the skin and subcutaneous tissue, unspecified: Secondary | ICD-10-CM

## 2019-08-06 DIAGNOSIS — L723 Sebaceous cyst: Secondary | ICD-10-CM | POA: Diagnosis not present

## 2019-08-06 MED ORDER — NYSTATIN 100000 UNIT/GM EX CREA: 1.0000 "application " | TOPICAL_CREAM | Freq: Two times a day (BID) | CUTANEOUS | 0 refills | Status: DC

## 2019-08-06 MED ORDER — DOXYCYCLINE HYCLATE 100 MG PO TABS: 100.0000 mg | ORAL_TABLET | Freq: Two times a day (BID) | ORAL | 0 refills | Status: DC

## 2019-08-06 NOTE — Patient Instructions (Addendum)
Thank you for coming in today. Keep the wound covered with gauze until it stop draining in a few days.  Apply the nystatin cream to the belly button 2x daily.  Ok to use the hydrocortisone cream.  Recheck as needed.  Take the antibiotic twice daily for 1 week.   Epidermal Cyst Removal, Care After This sheet gives you information about how to care for yourself after your procedure. Your health care provider may also give you more specific instructions. If you have problems or questions, contact your health care provider. What can I expect after the procedure? After the procedure, it is common to have:  Soreness in the area where your cyst was removed.  Tightness or itchiness from the stitches (sutures) in your skin. Follow these instructions at home: Medicines  Take over-the-counter and prescription medicines only as told by your health care provider.  If you were prescribed an antibiotic medicine or ointment, take or apply it as told by your health care provider. Do not stop using the antibiotic even if you start to feel better. Incision care   Follow instructions from your health care provider about how to take care of your incision. Make sure you: ? Wash your hands with soap and water before you change your bandage (dressing). If soap and water are not available, use hand sanitizer. ? Change your dressing as told by your health care provider. ? Leave sutures, skin glue, or adhesive strips in place. These skin closures may need to stay in place for 1-2 weeks or longer. If adhesive strip edges start to loosen and curl up, you may trim the loose edges. Do not remove adhesive strips completely unless your health care provider tells you to do that.  Keep the dressingdry until your health care provider says that it can be removed.  After your dressing is off, check your incision area every day for signs of infection. Check for: ? Redness, swelling, or pain. ? Fluid or  blood. ? Warmth. ? Pus or a bad smell. General instructions  Do not take baths, swim, or use a hot tub until your health care provider approves. Ask your health care provider if you may take showers. You may only be allowed to take sponge baths.  Your health care provider may ask you to avoid contact sports or activities that take a lot of effort. Do not do anything that stretches or puts pressure on your incision.  You can return to your normal diet.  Keep all follow-up visits as told by your health care provider. This is important. Contact a health care provider if:  You have a fever.  You have redness, swelling, or pain in the incision area.  You have fluid or blood coming from your incision.  You have pus or a bad smell coming from your incision.  Your incision feels warm to the touch.  Your cyst grows back. Summary  After the procedure, it is common to have soreness in the area where your cyst was removed.  Take or apply over-the-counter and prescription medicines only as told by your health care provider.  Follow instructions from your health care provider about how to take care of your incision. This information is not intended to replace advice given to you by your health care provider. Make sure you discuss any questions you have with your health care provider. Document Released: 12/17/2014 Document Revised: 03/18/2018 Document Reviewed: 09/19/2017 Elsevier Patient Education  2020 [employer].

## 2019-08-06 NOTE — Telephone Encounter (Signed)
-----   Message from Narda Rutherford, [place] sent at 08/06/2019 10:46 AM EDT ----- Patient's daughter is calling back. Please schedule.  ----- Message ----- From: Narda Rutherford, CMA Sent: 08/06/2019   8:13 AM EDT To: [facility]  Please call and schedule him to see Dr Georgina Snell for Friday.

## 2019-08-06 NOTE — Telephone Encounter (Signed)
Appointment has been made for this afternoon with Dr.Corey.

## 2019-08-06 NOTE — Progress Notes (Signed)
Mark Mora is a 85 y.o. male who presents to [facility] today for infected sebaceous cyst right inguinal crease, and rash in bellybutton.  Patient has a recurrent issue of a cystic structure in his right inguinal crease.  Occasionally the swells and becomes infected and drains.  He notes it is bothersome and painful today.  He is tried some warm compress which has not helped.  He denies fevers or chills and feels pretty well otherwise.  Additionally he notes a rash in his umbilicus area.  He has a skin get sometimes red there.  He is not tried any treatment for it yet.  He notes is been present for a few days.  ROS as above:  Exam:  BP (!) 157/73   Pulse (!) 108   Temp 98.3 F (36.8 C) (Oral)   Wt 134 lb 1.9 oz (60.8 kg)   BMI 22.32 kg/m  Wt Readings from Last 5 Encounters:  08/06/19 134 lb 1.9 oz (60.8 kg)  07/22/19 134 lb (60.8 kg)  06/25/19 137 lb (62.1 kg)  06/18/19 133 lb (60.3 kg)  02/17/19 135 lb (61.2 kg)    Gen: Well NAD HEENT: EOMI,  MMM Lungs: Normal work of breathing. CTABL Heart: RRR no MRG Abd: NABS, Soft. Nondistended, Nontender Exts: Brisk capillary refill, warm and well perfused.  Skin: Erythematous macular scaly lesion skin around umbilicus.  There is a bit of a fold of skin there and the rash is located in this region.  Nontender.  No fluctuance.  Right inguinal crease: Erythematous tender area with some fluctuance and central pore.  Consistent appearance with infected sebaceous cyst.  Abscess incision and drainage. Consent obtained and timeout performed. Skin cleaned with alcohol, and cold spray applied. 2.5 mL of lidocaine with epinephrine injected achieving good anesthesia. Skin was again cleaned with alcohol. A sharp incision was made to the area of fluctuance. The incision was widened and pus was expressed. Pus was cultured.  Blunt dissection was used to break up loculations. Further pus was expressed.  The sebaceous material was expressed and blunt dissection was used to remove either the entirety of the cyst wall or almost the entirety of the cyst wall. Patient tolerated the procedure well. A dressing was applied     Assessment and Plan: 85 y.o. male with  Infected sebaceous cyst right inguinal crease: Incised and drained today.  Fortunately I was able to remove either the entirety of the cyst wall or almost the entirety of the cyst wall.  Culture was obtained of the purulent material.  Empiric treatment with doxycycline antibiotics.  Discussed dressing management recheck as needed.  Rash in umbilicus is likely intertrigo with possible candidiasis.  Treat with nystatin cream and topical over-the-counter hydrocortisone cream.  PDMP not reviewed this encounter. Orders Placed This Encounter  Procedures  . Wound culture    Order Specific Question:   Source    Answer:   right inguinal crease asbcess   Meds ordered this encounter  Medications  . doxycycline (VIBRA-TABS) 100 MG tablet    Sig: Take 1 tablet (100 mg total) by mouth 2 (two) times daily.    Dispense:  14 tablet    Refill:  0  . nystatin cream (MYCOSTATIN)    Sig: Apply 1 application topically 2 (two) times daily. To belly button    Dispense:  30 g    Refill:  0     Historical information moved  to improve visibility of documentation.  Past Medical History:  Diagnosis Date  . Carotid atherosclerosis, right 11/05/2017  . CKD (chronic kidney disease) stage 3, GFR 30-59 ml/min (HCC) 11/05/2017  . Essential hypertension 11/05/2017  . Hearing loss due to cerumen impaction, bilateral 11/05/2017  . Hyperlipidemia   . Hypertension   . Pure hypercholesterolemia 11/05/2017  . Screening for diabetes mellitus 11/05/2017  . Stroke ([place])   . 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 HERNIA REPAIR     Social History   Tobacco Use  . Smoking status: Former Smoker    Packs/day: 0.25    Years: 6.00    Pack years: 1.50    Types: Cigarettes  . Smokeless tobacco: Never Used  . Tobacco comment: quit age 55  Substance Use Topics  . Alcohol use: No    Frequency: Never   family history is not on file.  Medications: Current Outpatient Medications  Medication Sig Dispense Refill  . acetaminophen (TYLENOL) 650 MG CR tablet Take by mouth.    Marland Kitchen amLODipine (NORVASC) 2.5 MG tablet TAKE 1 TABLET BY MOUTH  DAILY 90 tablet 3  . apixaban (ELIQUIS) 5 MG TABS tablet Take 1 tablet (5 mg total) by mouth 2 (two) times daily. 60 tablet 0  . atorvastatin (LIPITOR) 40 MG tablet Take 1 tablet (40 mg total) by mouth daily. Needs lab work 90 tablet 1  . Cholecalciferol (VITAMIN D3) 2000 units TABS Take by mouth.    . cloNIDine (CATAPRES) 0.1 MG tablet TAKE 1 TABLET BY MOUTH EVERY 6 HOURS AS NEEDED (SBP 170 OR DBP 100) FOR UP TO 30 DAYS.  0  . diclofenac sodium (VOLTAREN) 1 % GEL Apply 4 g topically 4 (four) times daily. To affected joint. 100 g 11  . famotidine (PEPCID) 20 MG tablet Take 1 tablet (20 mg total) by mouth 2 (two) times daily. 180 tablet 1  . fluticasone (FLONASE) 50 MCG/ACT nasal spray 1 spray by Both Nostrils route daily. 16 g 2  . furosemide (LASIX) 20 MG tablet TAKE 1 TABLET BY MOUTH  DAILY 90 tablet 0  . ipratropium (ATROVENT) 0.06 % nasal spray Place 2 sprays into both nostrils 4 (four) times daily as needed for rhinitis. 15 mL 0  . lisinopril (ZESTRIL) 20 MG tablet TAKE 1 TABLET BY MOUTH  DAILY 90 tablet 1  . mupirocin ointment (BACTROBAN) 2 % Apply to affected area TID for 7 days. 30 g 3  . tamsulosin (FLOMAX) 0.4 MG CAPS capsule TAKE 1 CAPSULE BY MOUTH  DAILY 90 capsule 1  . terbinafine (LAMISIL) 250 MG tablet Take 1 tablet (250 mg total) by mouth daily. 90 tablet 0  . doxycycline (VIBRA-TABS) 100 MG tablet Take 1 tablet (100 mg total) by mouth 2 (two) times  daily. 14 tablet 0  . nystatin cream (MYCOSTATIN) Apply 1 application topically 2 (two) times daily. To belly button 30 g 0   No current facility-administered medications for this visit.    No Known Allergies   Discussed warning signs or symptoms. Please see discharge instructions. Patient expresses understanding.

## 2019-08-09 LAB — WOUND CULTURE
MICRO NUMBER:: 820177
SPECIMEN QUALITY:: ADEQUATE

## 2019-08-10 ENCOUNTER — Other Ambulatory Visit: Payer: Self-pay | Admitting: Physician Assistant

## 2019-08-10 DIAGNOSIS — I2699 Other pulmonary embolism without acute cor pulmonale: Secondary | ICD-10-CM

## 2019-08-10 DIAGNOSIS — I6300 Cerebral infarction due to thrombosis of unspecified precerebral artery: Secondary | ICD-10-CM

## 2019-08-16 IMAGING — DX DG SHOULDER 2+V*R*
3 series · 3 of 3 positions shown · non-contrast
Comparison: None.

CLINICAL DATA: Right shoulder/humeral pain

EXAM:
RIGHT SHOULDER - 2+ VIEW

[shoulder grashey]
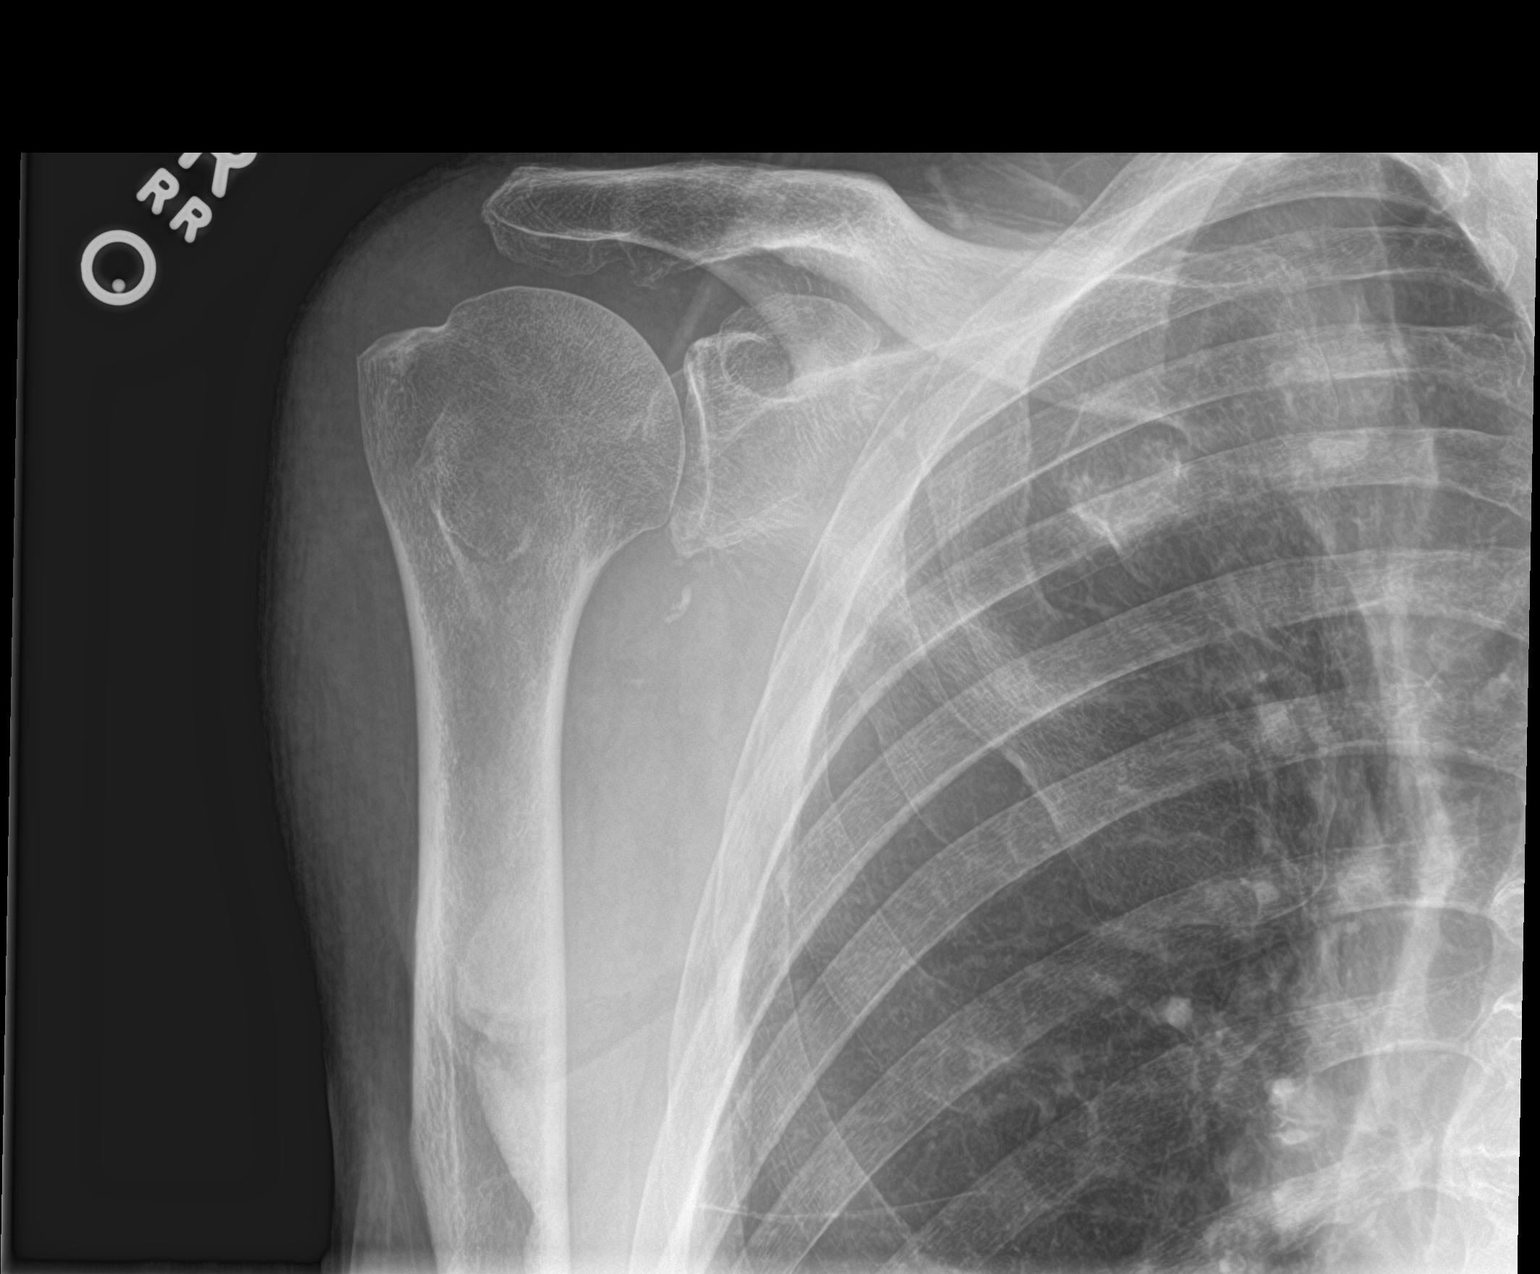

[shoulder y view]
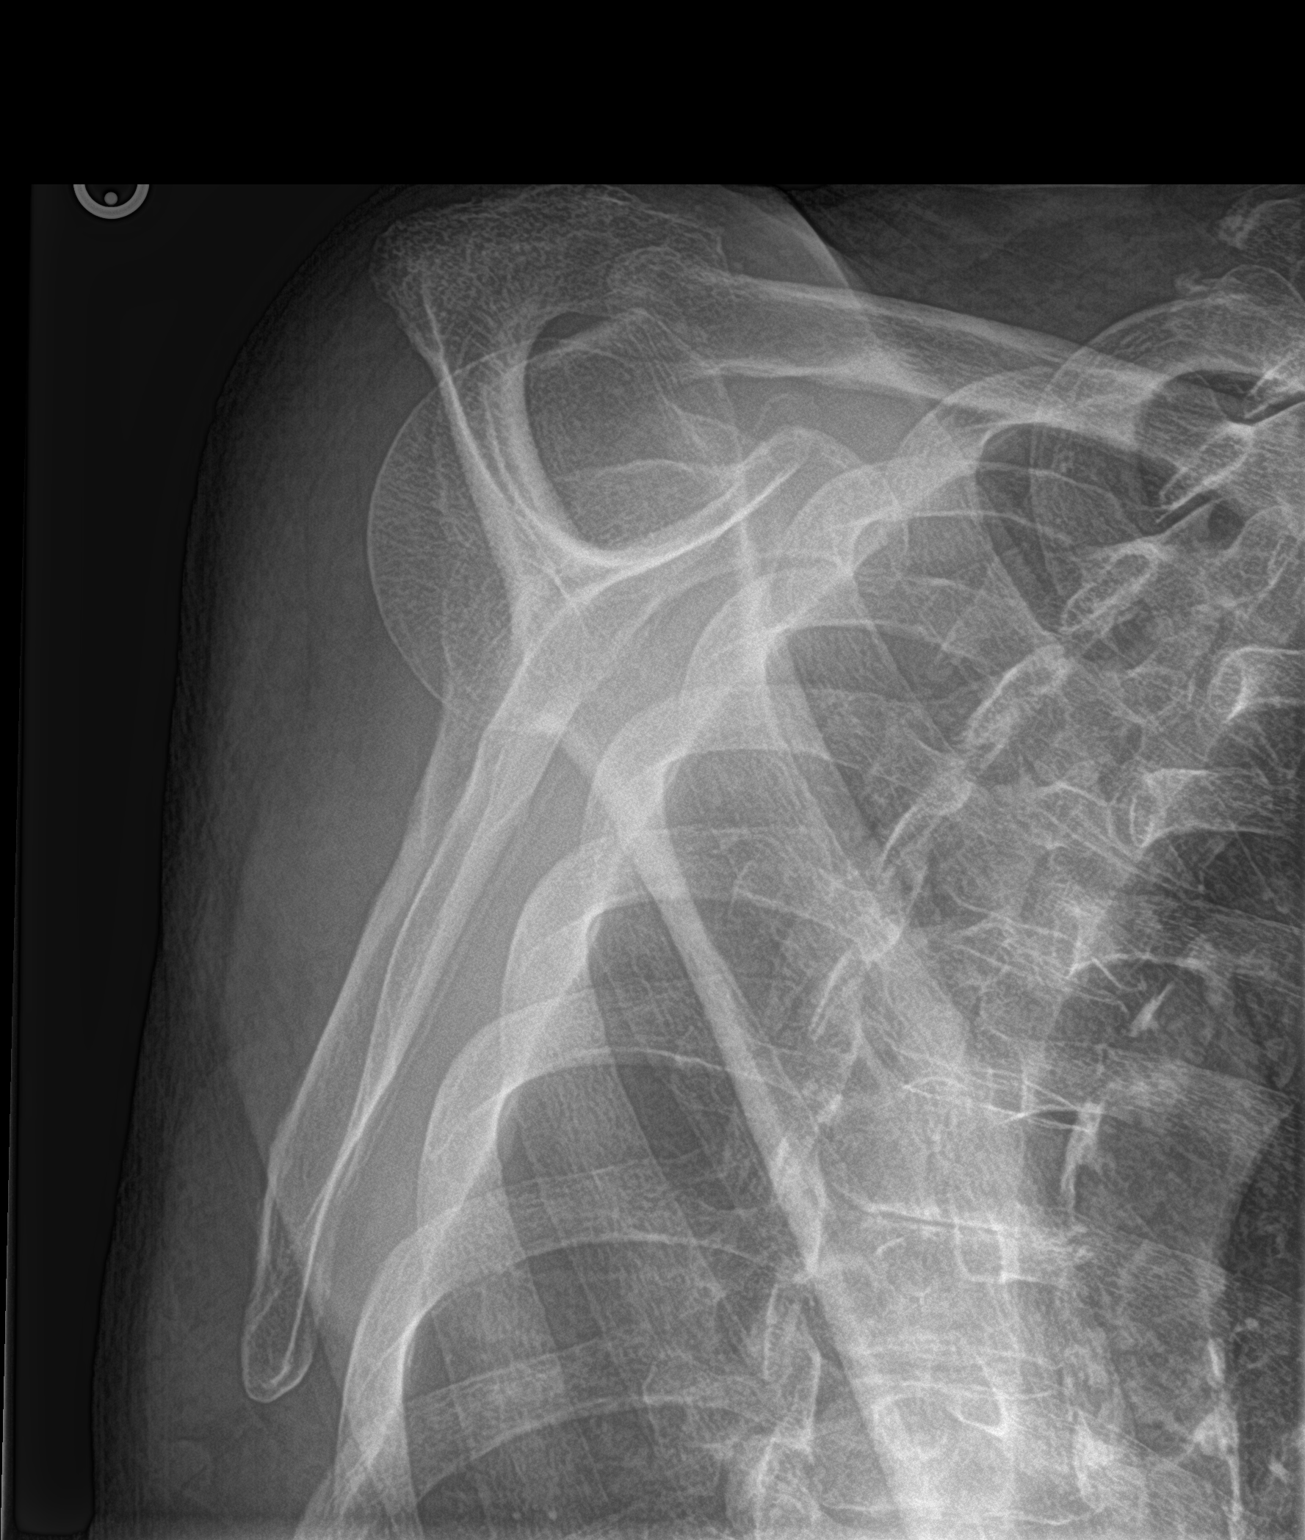

[shoulder axillary]
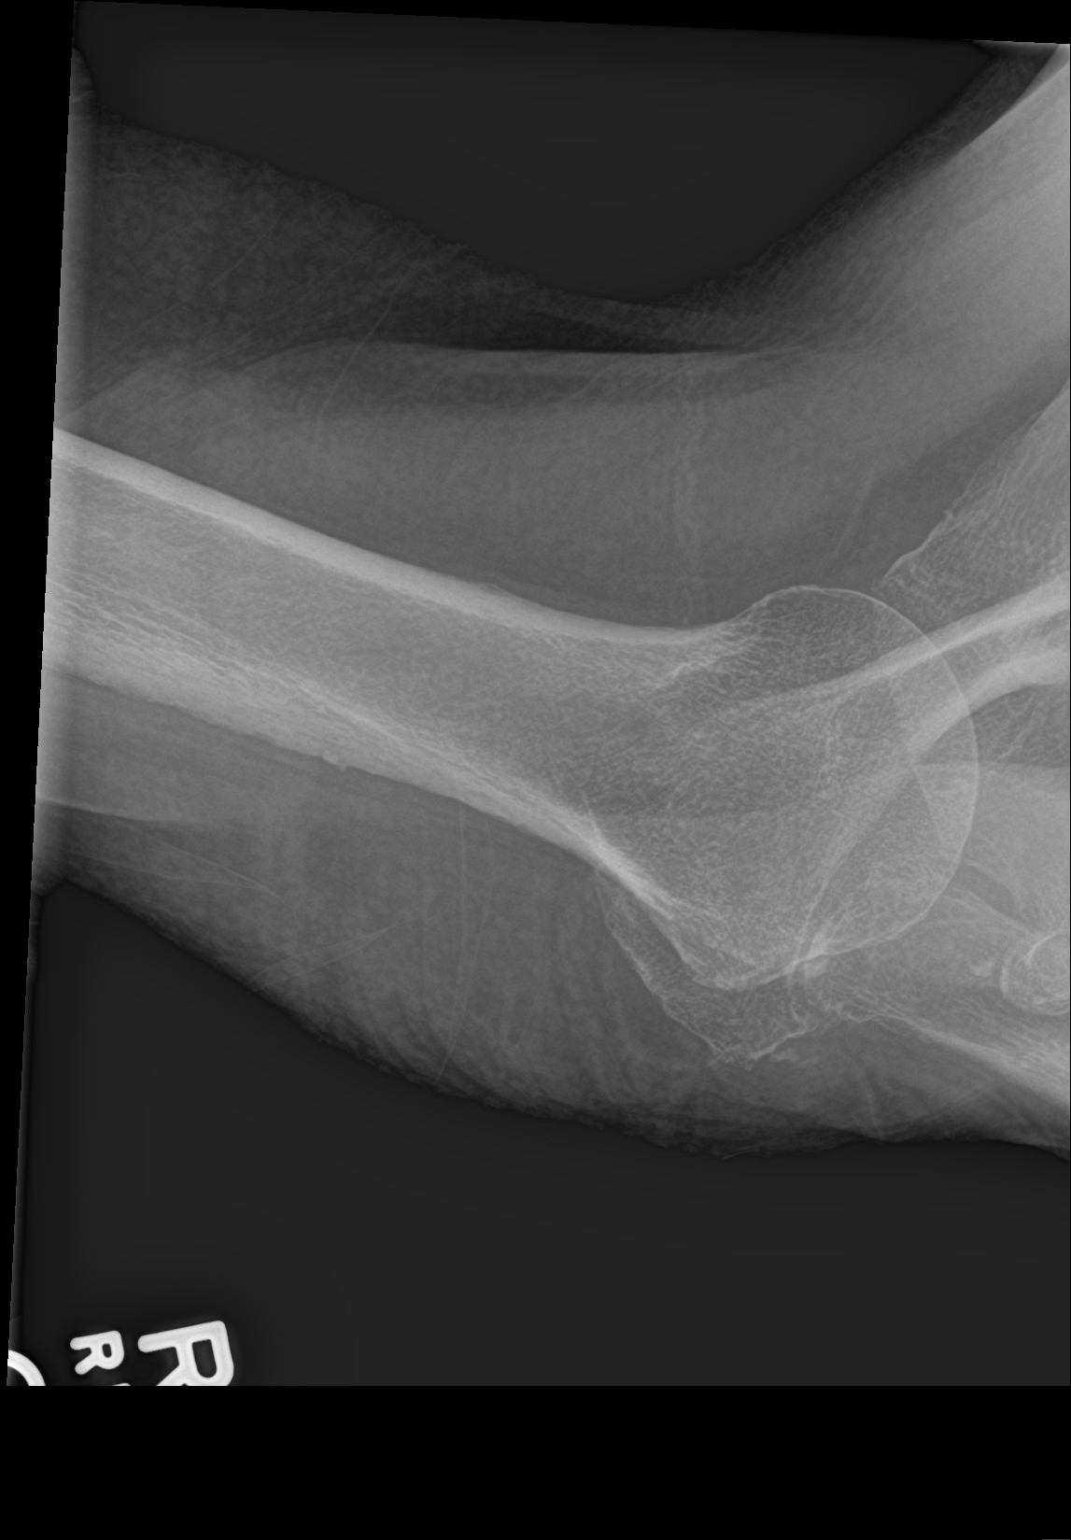

[3 of 3 positions shown; findings below may reference images not displayed]

FINDINGS: Degenerative changes in the AC joint with joint space narrowing and
spurring. Glenohumeral joint is maintained. No acute bony
abnormality. Specifically, no fracture, subluxation, or dislocation.
Soft tissues are intact.
IMPRESSION: Degenerative changes in the right AC joint. No acute bony
abnormality.

## 2019-09-10 ENCOUNTER — Ambulatory Visit (INDEPENDENT_AMBULATORY_CARE_PROVIDER_SITE_OTHER): Payer: Medicare Other | Admitting: Physician Assistant

## 2019-09-10 ENCOUNTER — Other Ambulatory Visit: Payer: Self-pay

## 2019-09-10 ENCOUNTER — Ambulatory Visit: Payer: Medicare Other | Admitting: Physician Assistant

## 2019-09-10 ENCOUNTER — Encounter: Payer: Self-pay | Admitting: Physician Assistant

## 2019-09-10 VITALS — BP 146/77 | HR 88 | Temp 98.6°F | Wt 137.0 lb

## 2019-09-10 DIAGNOSIS — H938X2 Other specified disorders of left ear: Secondary | ICD-10-CM

## 2019-09-10 DIAGNOSIS — B351 Tinea unguium: Secondary | ICD-10-CM

## 2019-09-10 DIAGNOSIS — R600 Localized edema: Secondary | ICD-10-CM | POA: Diagnosis not present

## 2019-09-10 DIAGNOSIS — Z79899 Other long term (current) drug therapy: Secondary | ICD-10-CM | POA: Diagnosis not present

## 2019-09-10 DIAGNOSIS — L304 Erythema intertrigo: Secondary | ICD-10-CM

## 2019-09-10 DIAGNOSIS — Z23 Encounter for immunization: Secondary | ICD-10-CM

## 2019-09-10 DIAGNOSIS — I878 Other specified disorders of veins: Secondary | ICD-10-CM

## 2019-09-10 MED ORDER — AMBULATORY NON FORMULARY MEDICATION: 0 refills | Status: AC

## 2019-09-10 MED ORDER — TERBINAFINE HCL 250 MG PO TABS: 250.0000 mg | ORAL_TABLET | Freq: Every day | ORAL | 0 refills | Status: DC

## 2019-09-10 MED ORDER — NYSTATIN 100000 UNIT/GM EX POWD: Freq: Three times a day (TID) | CUTANEOUS | 1 refills | Status: DC

## 2019-09-10 MED ORDER — HYDROCORTISONE-ACETIC ACID 1-2 % OT SOLN: 4.0000 [drp] | Freq: Two times a day (BID) | OTIC | 0 refills | Status: DC

## 2019-09-10 NOTE — Progress Notes (Signed)
Subjective:    Patient ID: Mark Mora, male    DOB: Jul 06, 1920, 85 y.o.   MRN: [id number]  HPI  Pt is a 85 yo male with toenail fungus who presents to the clinic for follow up.   He is accompanied by his daughter.   He started lamisil on July 9th. He has tolerated it well. He has started to see some new nail growth but nails continue to look very dystrophic, thick and yellow.   He is using cream for his rash in his umbilical area. It is improving. No itching and does not both him.   He continues to have intermittent swelling of both feet. Denies any SOB or orthopnea. Better in mornings. He admits he sits a lot during the day and does not wear compression stockings. He thinks he is using lasix.   Itchy irritation of left ear. No pain. No fever, chills, sinus pressure. Using flonase as needed.    .. Active Ambulatory Problems    Diagnosis Date Noted  . CKD (chronic kidney disease) stage 3, GFR 30-59 ml/min 11/05/2017  . 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 Essential hypertension 11/05/2017  . Carotid atherosclerosis, right 11/05/2017  . Pure hypercholesterolemia 11/05/2017  . Screening for diabetes mellitus 11/05/2017  . Hearing loss due to cerumen impaction, bilateral 11/05/2017  . Toenail fungus 03/20/2018  . Hearing loss of left ear due to cerumen impaction 03/20/2018  . Gastroesophageal reflux disease with esophagitis 03/23/2018  . Benign prostatic hyperplasia with urinary frequency 03/23/2018  . Neuropathy 03/23/2018  . History of prostate cancer 05/05/2018  . Numbness and tingling of both feet 05/05/2018  . Prostate cancer ([place]) 05/23/2018  . Weight loss 05/23/2018  . Spinal stenosis of lumbar region 05/23/2018  . Pulmonary embolus ([place]) 07/15/2018  . Cerebrovascular accident (CVA) due to thrombosis of precerebral artery ([place]) 07/15/2018  . Paresthesias 07/16/2018  . Hypotension 07/16/2018  . Slow transit constipation 07/26/2018  .  Bilateral upper abdominal discomfort 07/26/2018  . Burping 07/26/2018  . Opacity of lung on imaging study 07/26/2018  . Centrilobular emphysema ([place]) 08/15/2018  . Hyponatremia 08/15/2018  . Dystrophic nail 09/10/2018  . Bilateral edema of lower extremity 09/10/2018  . Chronic venous stasis dermatitis of both lower extremities 09/10/2018  . Degenerative joint disease of cervical spine 09/26/2018  . Elevated troponin 07/04/2018  . GERD (gastroesophageal reflux disease) 09/26/2018  . Hyperlipemia 09/26/2018  . Hyperglycemia 07/04/2018  . Bilateral impacted cerumen 11/23/2018  . Chronic venous stasis 01/19/2019  . Chronic right shoulder pain 01/19/2019  . Ganglion cyst of volar aspect of left wrist 02/17/2019  . Fatigue 05/15/2019  . Sleeping excessive 05/15/2019   Resolved Ambulatory Problems    Diagnosis Date Noted  . Ingrown toenail of left foot 03/20/2018  . BPH (benign prostatic hyperplasia) 09/26/2018   Past Medical History:  Diagnosis Date  . Hyperlipidemia   . Hypertension   . Stroke [facility])         Review of Systems See HPI.     Objective:   Physical Exam Vitals signs reviewed.  Constitutional:      Appearance: Normal appearance.  HENT:     Head: Normocephalic.     Right Ear: Tympanic membrane and ear canal normal.     Left Ear: Tympanic membrane and ear canal normal.     Ears:     Comments: Scales and external erythema left ear. Negative tragus pull test.     Nose: Nose normal.  Mouth/Throat:     Pharynx: Oropharynx is clear.  Cardiovascular:     Rate and Rhythm: Normal rate and regular rhythm.     Pulses: Normal pulses.  Pulmonary:     Effort: Pulmonary effort is normal.     Breath sounds: Normal breath sounds.  Musculoskeletal:     Right lower leg: Edema present.     Left lower leg: Edema present.     Comments: Pitting 1+. Erythematous feet.   Skin:    Comments: All toenails thick, dystrophic, yellow with some new healthy nail starting to  grow out.   Neurological:     General: No focal deficit present.     Mental Status: He is alert and oriented to person, place, and time.  Psychiatric:        Mood and Affect: Mood normal.        Behavior: Behavior normal.           Assessment & Plan:  .Marland KitchenJakel was seen today for nail problem.  Diagnoses and all orders for this visit:  Toenail fungus -     terbinafine (LAMISIL) 250 MG tablet; Take 1 tablet (250 mg total) by mouth daily.  Needs flu shot -     Flu Vaccine QUAD High Dose(Fluad)  Need for pneumococcal vaccination  Medication management -     Hepatic function panel  Bilateral edema of lower extremity -     AMBULATORY NON FORMULARY MEDICATION; Compression stockings for chronic venous stasis 20-59mmHg.  Chronic venous stasis  Intertrigo -     nystatin (MYCOSTATIN/NYSTOP) powder; Apply topically 3 (three) times daily.  Irritation of ear, left -     acetic acid-hydrocortisone (VOSOL-HC) OTIC solution; Place 4 drops into the left ear 2 (two) times daily. For 7 days.   Pt seems to be tolerating lamisil well. Recheck liver enzymes. Will continue if normal. There is some new healthy growth may take awhile to completely resolve.   Given powder to use as needed with cream for intertrigo.   Chronic venous stasis- use lasix as needed. After discussion patient was confusion with flomax and lasix. Discussed difference. He will now use as needed. Prescription compression stockings order. Keep feet elevated.   Reassured ears look great. Some scales in left ear and a little erythematous. Try acetic acid drops.   Follow up in 3 months.

## 2019-09-10 NOTE — Patient Instructions (Signed)
Continue on lamisil daily. Will call with liver enzymes.  Take 1/2 tablet of lasix for swelling.  Will get medial grade compression stockings.    Chronic Venous Insufficiency Chronic venous insufficiency is a condition where the leg veins cannot effectively pump blood from the legs to the heart. This happens when the vein walls are either stretched, weakened, or damaged, or when the valves inside the vein are damaged. With the right treatment, you should be able to continue with an active life. This condition is also called venous stasis. What are the causes? Common causes of this condition include:  High blood pressure inside the veins (venous hypertension).  Sitting or standing too long, causing increased blood pressure in the leg veins.  A blood clot that blocks blood flow in a vein (deep vein thrombosis, DVT).  Inflammation of a vein (phlebitis) that causes a blood clot to form.  Tumors in the pelvis that cause blood to back up. What increases the risk? The following factors may make you more likely to develop this condition:  Having a family history of this condition.  Obesity.  Pregnancy.  Living without enough regular physical activity or exercise (sedentary lifestyle).  Smoking.  Having a job that requires long periods of standing or sitting in one place.  Being a certain age. Women in their 8s and 35s and men in their 49s are more likely to develop this condition. What are the signs or symptoms? Symptoms of this condition include:  Veins that are enlarged, bulging, or twisted (varicose veins).  Skin breakdown or ulcers.  Reddened skin or dark discoloration of skin on the leg between the knee and ankle.  Brown, smooth, tight, and painful skin just above the ankle, usually on the inside of the leg (lipodermatosclerosis).  Swelling of the legs. How is this diagnosed? This condition may be diagnosed based on:  Your medical history.  A physical exam.  Tests,  such as: ? A procedure that creates an image of a blood vessel and nearby organs and provides information about blood flow through the blood vessel (duplex ultrasound). ? A procedure that tests blood flow (plethysmography). ? A procedure that looks at the veins using X-ray and dye (venogram). How is this treated? The goals of treatment are to help you return to an active life and to minimize pain or disability. Treatment depends on the severity of your condition, and it may include:  Wearing compression stockings. These can help relieve symptoms and help prevent your condition from getting worse. However, they do not cure the condition.  Sclerotherapy. This procedure involves an injection of a solution that shrinks damaged veins.  Surgery. This may involve: ? Removing a diseased vein (vein stripping). ? Cutting off blood flow through the vein (laser ablation surgery). ? Repairing or reconstructing a valve within the affected vein. Follow these instructions at home:      Wear compression stockings as told by your health care provider. These stockings help to prevent blood clots and reduce swelling in your legs.  Take over-the-counter and prescription medicines only as told by your health care provider.  Stay active by exercising, walking, or doing different activities. Ask your health care provider what activities are safe for you and how much exercise you need.  Drink enough fluid to keep your urine pale yellow.  Do not use any products that contain nicotine or tobacco, such as cigarettes, e-cigarettes, and chewing tobacco. If you need help quitting, ask your health care provider.  Keep  all follow-up visits as told by your health care provider. This is important. Contact a health care provider if you:  Have redness, swelling, or more pain in the affected area.  See a red streak or line that goes up or down from the affected area.  Have skin breakdown or skin loss in the affected  area, even if the breakdown is small.  Get an injury in the affected area. Get help right away if:  You get an injury and an open wound in the affected area.  You have: ? Severe pain that does not get better with medicine. ? Sudden numbness or weakness in the foot or ankle below the affected area. ? Trouble moving your foot or ankle. ? A fever. ? Worse or persistent symptoms. ? Chest pain. ? Shortness of breath. Summary  Chronic venous insufficiency is a condition where the leg veins cannot effectively pump blood from the legs to the heart.  Chronic venous insufficiency occurs when the vein walls become stretched, weakened, or damaged, or when valves within the vein are damaged.  Treatment depends on how severe your condition is. It often involves wearing compression stockings and may involve having a procedure.  Make sure you stay active by exercising, walking, or doing different activities. Ask your health care provider what activities are safe for you and how much exercise you need. This information is not intended to replace advice given to you by your health care provider. Make sure you discuss any questions you have with your health care provider. Document Released: 04/01/2007 Document Revised: 08/19/2018 Document Reviewed: 08/19/2018 Elsevier Patient Education  2020 [employer].

## 2019-09-11 LAB — HEPATIC FUNCTION PANEL
AG Ratio: 1.6 (calc) (ref 1.0–2.5)
ALT: 11 U/L (ref 9–46)
AST: 16 U/L (ref 10–35)
Albumin: 3.7 g/dL (ref 3.6–5.1)
Alkaline phosphatase (APISO): 42 U/L (ref 35–144)
Bilirubin, Direct: 0.1 mg/dL (ref 0.0–0.2)
Globulin: 2.3 g/dL (calc) (ref 1.9–3.7)
Indirect Bilirubin: 0.3 mg/dL (calc) (ref 0.2–1.2)
Total Bilirubin: 0.4 mg/dL (ref 0.2–1.2)
Total Protein: 6 g/dL — ABNORMAL LOW (ref 6.1–8.1)

## 2019-09-11 NOTE — Progress Notes (Signed)
Liver enzymes look great. Ok to continue with lamisil.   Total protein a little low. Make sure eating enough protein.

## 2019-09-18 ENCOUNTER — Ambulatory Visit: Payer: Medicare Other | Admitting: Physician Assistant

## 2019-09-20 ENCOUNTER — Other Ambulatory Visit: Payer: Self-pay | Admitting: Physician Assistant

## 2019-09-20 DIAGNOSIS — I6300 Cerebral infarction due to thrombosis of unspecified precerebral artery: Secondary | ICD-10-CM

## 2019-09-20 DIAGNOSIS — I2699 Other pulmonary embolism without acute cor pulmonale: Secondary | ICD-10-CM

## 2019-09-21 ENCOUNTER — Other Ambulatory Visit: Payer: Self-pay | Admitting: Neurology

## 2019-09-21 DIAGNOSIS — I2699 Other pulmonary embolism without acute cor pulmonale: Secondary | ICD-10-CM

## 2019-09-21 DIAGNOSIS — I6300 Cerebral infarction due to thrombosis of unspecified precerebral artery: Secondary | ICD-10-CM

## 2019-09-21 MED ORDER — APIXABAN 5 MG PO TABS: 5.0000 mg | ORAL_TABLET | Freq: Two times a day (BID) | ORAL | 0 refills | Status: DC

## 2019-09-28 ENCOUNTER — Other Ambulatory Visit: Payer: Self-pay | Admitting: Physician Assistant

## 2019-09-28 DIAGNOSIS — R350 Frequency of micturition: Secondary | ICD-10-CM

## 2019-09-28 DIAGNOSIS — N401 Enlarged prostate with lower urinary tract symptoms: Secondary | ICD-10-CM

## 2019-10-01 ENCOUNTER — Ambulatory Visit (INDEPENDENT_AMBULATORY_CARE_PROVIDER_SITE_OTHER): Payer: Medicare Other | Admitting: Family Medicine

## 2019-10-01 ENCOUNTER — Encounter: Payer: Self-pay | Admitting: Family Medicine

## 2019-10-01 ENCOUNTER — Other Ambulatory Visit: Payer: Self-pay

## 2019-10-01 VITALS — BP 159/75 | HR 89 | Temp 98.4°F | Wt 133.0 lb

## 2019-10-01 DIAGNOSIS — L97529 Non-pressure chronic ulcer of other part of left foot with unspecified severity: Secondary | ICD-10-CM | POA: Diagnosis not present

## 2019-10-01 NOTE — Progress Notes (Signed)
Mark Mora is a 85 y.o. male who presents to [facility] today for left great toe pain.  Patient notes pain at the tip of his left great toe associated with a new skin lesion.  He cannot recall any injury.  He thinks is been present for about a month and is worsening slightly.  He denies any significant bleeding or injury.  No fevers or chills nausea vomiting or diarrhea.  He does have a history of onychomycosis currently being treated with terbinafine.  He has seen a podiatrist previously but did not like the podiatrist at [facility].  No specific treatment tried for current problem.   ROS as above:  Exam:  BP (!) 159/75   Pulse 89   Temp 98.4 F (36.9 C) (Oral)   Wt 133 lb (60.3 kg)   BMI 22.13 kg/m  Wt Readings from Last 5 Encounters:  10/01/19 133 lb (60.3 kg)  09/10/19 137 lb (62.1 kg)  08/06/19 134 lb 1.9 oz (60.8 kg)  07/22/19 134 lb (60.8 kg)  06/25/19 137 lb (62.1 kg)    Gen: Well NAD Left great toe with thickened yellowed toenail with small several millimeter hyperpigmented tender skin lesion at the tip of the toe.  Is somewhat soft to palpation with tenderness.  Appears to be discrete from surrounding skin.  No bleeding with mild palpation.  Cap refill and sensation are intact.      Lab and Radiology Results No results found for this or any previous visit (from the past 72 hour(s)). No results found.    Assessment and Plan: 85 y.o. male with painful skin lesion left great toe.  Unclear etiology.  Could be pressure sore versus developing pyogenic granuloma.  Regardless this likely will require the attention of a dedicated podiatrist.  He is higher risk for infection or poor healing.  Plan for offloading until can be seen by podiatrist.  Recheck back with PCP in near future if needed.  PDMP not reviewed this encounter. Orders Placed  This Encounter  Procedures  . Ambulatory referral to Podiatry    Referral Priority:   Routine    Referral Type:   Consultation    Referral Reason:   Specialty Services Required    Requested Specialty:   Podiatry    Number of Visits Requested:   1   No orders of the defined types were placed in this encounter.    Historical information moved to improve visibility of documentation.  Past Medical History:  Diagnosis Date  . Carotid atherosclerosis, right 11/05/2017  . CKD (chronic kidney disease) stage 3, GFR 30-59 ml/min 11/05/2017  . Essential hypertension 11/05/2017  . Hearing loss due to cerumen impaction, bilateral 11/05/2017  . Hyperlipidemia   . Hypertension   . Pure hypercholesterolemia 11/05/2017  . Screening for diabetes mellitus 11/05/2017  . Stroke ([place])   . 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 HERNIA REPAIR     Social History   Tobacco Use  . Smoking status: Former Smoker    Packs/day: 0.25    Years: 6.00    Pack years: 1.50    Types: Cigarettes  . Smokeless tobacco: Never Used  . Tobacco comment: quit age 61  Substance Use Topics  . Alcohol use: No    Frequency: Never   family history is not on file.  Medications: Current  Outpatient Medications  Medication Sig Dispense Refill  . acetaminophen (TYLENOL) 650 MG CR tablet Take by mouth.    Marland Kitchen acetic acid-hydrocortisone (VOSOL-HC) OTIC solution Place 4 drops into the left ear 2 (two) times daily. For 7 days. 10 mL 0  . AMBULATORY NON FORMULARY MEDICATION Compression stockings for chronic venous stasis 20-63mmHg. 2 Device 0  . amLODipine (NORVASC) 2.5 MG tablet TAKE 1 TABLET BY MOUTH  DAILY 90 tablet 3  . apixaban (ELIQUIS) 5 MG TABS tablet Take 1 tablet (5 mg total) by mouth 2 (two) times daily. 60 tablet 0  . atorvastatin (LIPITOR) 40 MG tablet Take 1 tablet (40 mg total) by mouth daily. Needs lab work 90 tablet 1  .  Cholecalciferol (VITAMIN D3) 2000 units TABS Take by mouth.    . cloNIDine (CATAPRES) 0.1 MG tablet TAKE 1 TABLET BY MOUTH EVERY 6 HOURS AS NEEDED (SBP 170 OR DBP 100) FOR UP TO 30 DAYS.  0  . diclofenac sodium (VOLTAREN) 1 % GEL Apply 4 g topically 4 (four) times daily. To affected joint. 100 g 11  . famotidine (PEPCID) 20 MG tablet Take 1 tablet (20 mg total) by mouth 2 (two) times daily. 180 tablet 1  . fluticasone (FLONASE) 50 MCG/ACT nasal spray 1 spray by Both Nostrils route daily. 16 g 2  . furosemide (LASIX) 20 MG tablet TAKE 1 TABLET BY MOUTH  DAILY 90 tablet 3  . ipratropium (ATROVENT) 0.06 % nasal spray Place 2 sprays into both nostrils 4 (four) times daily as needed for rhinitis. 15 mL 0  . lisinopril (ZESTRIL) 20 MG tablet TAKE 1 TABLET BY MOUTH  DAILY 90 tablet 1  . mupirocin ointment (BACTROBAN) 2 % Apply to affected area TID for 7 days. 30 g 3  . nystatin (MYCOSTATIN/NYSTOP) powder Apply topically 3 (three) times daily. 45 g 1  . nystatin cream (MYCOSTATIN) Apply 1 application topically 2 (two) times daily. To belly button 30 g 0  . tamsulosin (FLOMAX) 0.4 MG CAPS capsule TAKE 1 CAPSULE BY MOUTH  DAILY 90 capsule 3  . terbinafine (LAMISIL) 250 MG tablet Take 1 tablet (250 mg total) by mouth daily. 90 tablet 0   No current facility-administered medications for this visit.    No Known Allergies   Discussed warning signs or symptoms. Please see discharge instructions. Patient expresses understanding.

## 2019-10-01 NOTE — Patient Instructions (Signed)
Thank you for coming in today. You should hear from podiatry soon.  Let me know if you do not hear from them.    Pressure Injury  A pressure injury is damage to the skin and underlying tissue that results from pressure being applied to an area of the body. It often affects people who must spend a long time in a bed or chair because of a medical condition. Pressure injuries usually occur:  Over bony parts of the body, such as the tailbone, shoulders, elbows, hips, heels, spine, ankles, and back of the head.  Under medical devices that make contact with the body, such as respiratory equipment, stockings, tubes, and splints. Pressure injuries start as reddened areas on the skin and can lead to pain and an open wound. What are the causes? This condition is caused by frequent or constant pressure to an area of the body. Decreased blood flow to the skin can eventually cause the skin tissue to die and break down, causing a wound. What increases the risk? You are more likely to develop this condition if you:  Are in the hospital or an extended care facility.  Are bedridden or in a wheelchair.  Have an injury or disease that keeps you from: ? Moving normally. ? Feeling pain or pressure.  Have a condition that: ? Makes you sleepy or less alert. ? Causes poor blood flow.  Need to wear a medical device.  Have poor control of your bladder or bowel functions (incontinence).  Have poor nutrition (malnutrition). If you are at risk for pressure injuries, your health care provider may recommend certain types of mattresses, mattress covers, pillows, cushions, or boots to help prevent them. These may include products filled with air, foam, gel, or sand. What are the signs or symptoms? Symptoms of this condition depend on the severity of the injury. Symptoms may include:  Red or dark areas of the skin.  Pain, warmth, or a change of skin texture.  Blisters.  An open wound. How is this  diagnosed? This condition is diagnosed with a medical history and physical exam. You may also have tests, such as:  Blood tests.  Imaging tests.  Blood flow tests. Your pressure injury will be staged based on its severity. Staging is based on:  The depth of the tissue injury, including whether there is exposure of muscle, bone, or tendon.  The cause of the pressure injury. How is this treated? This condition may be treated by:  Relieving or redistributing pressure on your skin. This includes: ? Frequently changing your position. ? Avoiding positions that caused the wound or that can make the wound worse. ? Using specific bed mattresses, chair cushions, or protective boots. ? Moving medical devices from an area of pressure, or placing padding between the skin and the device. ? Using foams, creams, or powders to prevent rubbing (friction) on the skin.  Keeping your skin clean and dry. This may include using a skin cleanser or skin barrier as told by your health care provider.  Cleaning your injury and removing any dead tissue from the wound (debridement).  Placing a bandage (dressing) over your injury.  Using medicines for pain or to prevent or treat infection. Surgery may be needed if other treatments are not working or if your injury is very deep. Follow these instructions at home: Wound care  Follow instructions from your health care provider about how to take care of your wound. Make sure you: ? Wash your hands with soap  and water before and after you change your bandage (dressing). If soap and water are not available, use hand sanitizer. ? Change your dressing as told by your health care provider.  Check your wound every day for signs of infection. Have a caregiver do this for you if you are not able. Check for: ? Redness, swelling, or increased pain. ? More fluid or blood. ? Warmth. ? Pus or a bad smell. Skin care  Keep your skin clean and dry. Gently pat your skin  dry.  Do not rub or massage your skin.  You or a caregiver should check your skin every day for any changes in color or any new blisters or sores (ulcers). Medicines  Take over-the-counter and prescription medicines only as told by your health care provider.  If you were prescribed an antibiotic medicine, take or apply it as told by your health care provider. Do not stop using the antibiotic even if your condition improves. Reducing and redistributing pressure  Do not lie or sit in one position for a long time. Move or change position every 1-2 hours, or as told by your health care provider.  Use pillows or cushions to reduce pressure. Ask your health care provider to recommend cushions or pads for you. General instructions   Eat a healthy diet that includes lots of protein.  Drink enough fluid to keep your urine pale yellow.  Be as active as you can every day. Ask your health care provider to suggest safe exercises or activities.  Do not abuse drugs or alcohol.  Do not use any products that contain nicotine or tobacco, such as cigarettes, e-cigarettes, and chewing tobacco. If you need help quitting, ask your health care provider.  Keep all follow-up visits as told by your health care provider. This is important. Contact a health care provider if:  You have: ? A fever or chills. ? Pain that is not helped by medicine. ? Any changes in skin color. ? New blisters or sores. ? Pus or a bad smell coming from your wound. ? Redness, swelling, or pain around your wound. ? More fluid or blood coming from your wound.  Your wound does not improve after 1-2 weeks of treatment. Summary  A pressure injury is damage to the skin and underlying tissue that results from pressure being applied to an area of the body.  Do not lie or sit in one position for a long time. Your health care provider may advise you to move or change position every 1-2 hours.  Follow instructions from your health  care provider about how to take care of your wound.  Keep all follow-up visits as told by your health care provider. This is important. This information is not intended to replace advice given to you by your health care provider. Make sure you discuss any questions you have with your health care provider. Document Released: 11/26/2005 Document Revised: 06/25/2018 Document Reviewed: 06/25/2018 Elsevier Patient Education  [place].

## 2019-11-10 NOTE — Progress Notes (Unsigned)
Subjective:   Mark Mora is a 85 y.o. male who presents for Medicare Annual/Subsequent preventive examination.  Review of Systems:  No ROS.  Medicare Wellness Virtual Visit.  Visual/audio telehealth visit, UTA vital signs.   See social history for additional risk factors.       Sleep patterns:    Home Safety/Smoke Alarms: Feels safe in home. Smoke alarms in place.  Living environment;  Seat Belt Safety/Bike Helmet: Wears seat belt.    Male:   CCS- Aged out    PSA- UTD Lab Results  Component Value Date   PSA 37.1 (H) 06/24/2019   PSA 30.8 (H) 08/01/2018   PSA 36.7 (H) 03/20/2018        Objective:    Vitals: There were no vitals taken for this visit.  There is no height or weight on file to calculate BMI.  Advanced Directives 11/10/2018  Does Patient Have a Medical Advance Directive? Yes  Type of Paramedic of [place];Living will  Does patient want to make changes to medical advance directive? No - Patient declined  Copy of [place] in Chart? Yes - validated most recent copy scanned in chart (See row information)    Tobacco Social History   Tobacco Use  Smoking Status Former Smoker  . Packs/day: 0.25  . Years: 6.00  . Pack years: 1.50  . Types: Cigarettes  Smokeless Tobacco Never Used  Tobacco Comment   quit age 51     Counseling given: Not Answered Comment: quit age 46   Clinical Intake:                       Past Medical History:  Diagnosis Date  . Carotid atherosclerosis, right 11/05/2017  . CKD (chronic kidney disease) stage 3, GFR 30-59 ml/min 11/05/2017  . Essential hypertension 11/05/2017  . Hearing loss due to cerumen impaction, bilateral 11/05/2017  . Hyperlipidemia   . Hypertension   . Pure hypercholesterolemia 11/05/2017  . Screening for diabetes mellitus 11/05/2017  . Stroke ([place])   . 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 HERNIA REPAIR     No family history on file. Social History   Socioeconomic History  . Marital status: Widowed    Spouse name: Not on file  . Number of children: 2  . Years of education: 50  . Highest education level: 11th grade  Occupational History  . Occupation: retired    Comment: [employer]  Social Needs  . Financial resource strain: Not hard at all  . Food insecurity    Worry: Never true    Inability: Never true  . Transportation needs    Medical: No    Non-medical: Not on file  Tobacco Use  . Smoking status: Former Smoker    Packs/day: 0.25    Years: 6.00    Pack years: 1.50    Types: Cigarettes  . Smokeless tobacco: Never Used  . Tobacco comment: quit age 61  Substance and Sexual Activity  . Alcohol use: No    Frequency: Never  . Drug use: No  . Sexual activity: Never  Lifestyle  . Physical activity    Days per week: 0 days    Minutes per session: 0 min  . Stress: Not at all  Relationships  . Social connections    Talks on phone: More than three times a week  Gets together: Twice a week    Attends religious service: Never    Active member of club or organization: Yes    Attends meetings of clubs or organizations: 1 to 4 times per year    Relationship status: Widowed  Other Topics Concern  . Not on file  Social History Narrative   Reads daily on his IPAD. Coffee daily.     Outpatient Encounter Medications as of 11/16/2019  Medication Sig  . acetaminophen (TYLENOL) 650 MG CR tablet Take by mouth.  Marland Kitchen acetic acid-hydrocortisone (VOSOL-HC) OTIC solution Place 4 drops into the left ear 2 (two) times daily. For 7 days.  . AMBULATORY NON FORMULARY MEDICATION Compression stockings for chronic venous stasis 20-10mmHg.  Marland Kitchen amLODipine (NORVASC) 2.5 MG tablet TAKE 1 TABLET BY MOUTH  DAILY  . apixaban (ELIQUIS) 5 MG TABS tablet Take 1 tablet (5 mg total) by mouth 2 (two) times daily.  Marland Kitchen atorvastatin (LIPITOR) 40 MG tablet Take 1  tablet (40 mg total) by mouth daily. Needs lab work  . Cholecalciferol (VITAMIN D3) 2000 units TABS Take by mouth.  . cloNIDine (CATAPRES) 0.1 MG tablet TAKE 1 TABLET BY MOUTH EVERY 6 HOURS AS NEEDED (SBP 170 OR DBP 100) FOR UP TO 30 DAYS.  Marland Kitchen diclofenac sodium (VOLTAREN) 1 % GEL Apply 4 g topically 4 (four) times daily. To affected joint.  . famotidine (PEPCID) 20 MG tablet Take 1 tablet (20 mg total) by mouth 2 (two) times daily.  . fluticasone (FLONASE) 50 MCG/ACT nasal spray 1 spray by Both Nostrils route daily.  . furosemide (LASIX) 20 MG tablet TAKE 1 TABLET BY MOUTH  DAILY  . ipratropium (ATROVENT) 0.06 % nasal spray Place 2 sprays into both nostrils 4 (four) times daily as needed for rhinitis.  Marland Kitchen lisinopril (ZESTRIL) 20 MG tablet TAKE 1 TABLET BY MOUTH  DAILY  . mupirocin ointment (BACTROBAN) 2 % Apply to affected area TID for 7 days.  Marland Kitchen nystatin (MYCOSTATIN/NYSTOP) powder Apply topically 3 (three) times daily.  Marland Kitchen nystatin cream (MYCOSTATIN) Apply 1 application topically 2 (two) times daily. To belly button  . tamsulosin (FLOMAX) 0.4 MG CAPS capsule TAKE 1 CAPSULE BY MOUTH  DAILY  . terbinafine (LAMISIL) 250 MG tablet Take 1 tablet (250 mg total) by mouth daily.   No facility-administered encounter medications on file as of 11/16/2019.     Activities of Daily Living In your present state of health, do you have any difficulty performing the following activities: 11/10/2018  Hearing? Y  Comment some loss of hearing. Thinksnit cerumen impaction.  Vision? N  Difficulty concentrating or making decisions? N  Walking or climbing stairs? Y  Dressing or bathing? N  Doing errands, shopping? N  Preparing Food and eating ? N  Using the Toilet? N  In the past six months, have you accidently leaked urine? Y  Comment leaks urine daily and doesn't know it.  Do you have problems with loss of bowel control? N  Managing your Medications? N  Managing your Finances? N  Housekeeping or managing your  Housekeeping? N  Some recent data might be hidden    Patient Care Team: Lavada Mesi as PCP - General (Family Medicine)   Assessment:   This is a routine wellness examination for [name].Physical assessment deferred to PCP.   Exercise Activities and Dietary recommendations   Diet  Breakfast: Lunch:  Dinner:       Goals   None     Fall Risk Fall Risk  06/25/2019 11/10/2018  Falls in the past year? - 1  Number falls in past yr: - 1  Injury with Fall? - 0  Risk for fall due to : Impaired mobility Impaired balance/gait;Impaired mobility;History of fall(s)  Follow up Falls prevention discussed;Falls evaluation completed;Follow up appointment;Education provided Falls prevention discussed   Is the patient's home free of loose throw rugs in walkways, pet beds, electrical cords, etc?   {Blank single:19197::"yes","no"}      Grab bars in the bathroom? {Blank single:19197::"yes","no"}      Handrails on the stairs?   {Blank single:19197::"yes","no"}      Adequate lighting?   {Blank single:19197::"yes","no"}   Depression Screen PHQ 2/9 Scores 11/10/2018 11/05/2017 11/05/2017  PHQ - 2 Score 0 0 0  PHQ- 9 Score - 1 -    Cognitive Function     6CIT Screen 11/10/2018  What Year? 0 points  What month? 0 points  What time? 0 points  Count back from 20 0 points  Months in reverse 0 points  Repeat phrase 0 points  Total Score 0    Immunization History  Administered Date(s) Administered  . Fluad Quad(high Dose 65+) 09/10/2019  . Influenza,inj,Quad PF,6+ Mos 10/09/2018    Screening Tests Health Maintenance  Topic Date Due  . TETANUS/TDAP  07/02/1941  . PNA vac Low Risk Adult (2 of 2 - PPSV23) 12/11/1999  . INFLUENZA VACCINE  Completed        Plan:   ***  I have personally reviewed and noted the following in the patient's chart:   . Medical and social history . Use of alcohol, tobacco or illicit drugs  . Current medications and supplements . Functional ability  and status . Nutritional status . Physical activity . Advanced directives . List of other physicians . Hospitalizations, surgeries, and ER visits in previous 12 months . Vitals . Screenings to include cognitive, depression, and falls . Referrals and appointments  In addition, I have reviewed and discussed with patient certain preventive protocols, quality metrics, and best practice recommendations. A written personalized care plan for preventive services as well as general preventive health recommendations were provided to patient.     Joanne Chars, LPN  01/16/2535

## 2019-11-11 ENCOUNTER — Telehealth: Payer: Self-pay

## 2019-11-11 MED ORDER — FAMOTIDINE 20 MG PO TABS: 20.0000 mg | ORAL_TABLET | Freq: Two times a day (BID) | ORAL | 3 refills | Status: DC

## 2019-11-11 MED ORDER — FAMOTIDINE 20 MG PO TABS: 20.0000 mg | ORAL_TABLET | Freq: Two times a day (BID) | ORAL | 0 refills | Status: DC

## 2019-11-11 NOTE — Telephone Encounter (Signed)
Ok sent!

## 2019-11-11 NOTE — Telephone Encounter (Signed)
Patient called stating that he gets medications from Mail [facility]. He was advised that they do not have any famotidine.   Patient request 30 day Famotidine RF be sent to [facility] and a new medication to replace Famotidine be sent to [facility].   30 day supply sent, please advise on medication to send to mail order as replacement

## 2019-11-16 ENCOUNTER — Ambulatory Visit: Payer: Medicare Other

## 2019-11-18 NOTE — Progress Notes (Signed)
Subjective:   Mark Mora is a 85 y.o. male who presents for Medicare Annual/Subsequent preventive examination.  Review of Systems:  No ROS.  Medicare Wellness Virtual Visit.  Visual/audio telehealth visit, UTA vital signs.   See social history for additional risk factors.    Cardiac Risk Factors include: advanced age (>36men, >58 women);hypertension;male gender;sedentary lifestyle  Sleep patterns: Getting 12 hours of sleep a night. Wakes up 3 times a night to void. Wakes up and feels rested. Takes naps during the day    Home Safety/Smoke Alarms: Feels safe in home. Smoke alarms in place.  Living environment; Lives with daughter in a 1 story home and no stairs in the home. Shower is a step over tub combo and has a chair in place. Seat Belt Safety/Bike Helmet: Wears seat belt.    Male:   CCS- Aged out     PSA- UTD Lab Results  Component Value Date   PSA 37.1 (H) 06/24/2019   PSA 30.8 (H) 08/01/2018   PSA 36.7 (H) 03/20/2018        Objective:    Vitals: There were no vitals taken for this visit.  There is no height or weight on file to calculate BMI.  Advanced Directives 11/24/2019 11/10/2018  Does Patient Have a Medical Advance Directive? Yes Yes  Type of Paramedic of [place];Living will [place];Living will  Does patient want to make changes to medical advance directive? No - Patient declined No - Patient declined  Copy of [place] in Chart? No - copy requested Yes - validated most recent copy scanned in chart (See row information)    Tobacco Social History   Tobacco Use  Smoking Status Former Smoker  . Packs/day: 0.25  . Years: 6.00  . Pack years: 1.50  . Types: Cigarettes  Smokeless Tobacco Never Used  Tobacco Comment   quit age 51     Counseling given: Not Answered Comment: quit age 85   Clinical Intake:  Pre-visit preparation completed: Yes  Pain : 0-10 Pain Score: 4  Pain  Type: Chronic pain Pain Location: Back Pain Orientation: Right, Left Pain Radiating Towards: radiates to leg Pain Descriptors / Indicators: Aching, Throbbing Pain Onset: More than a month ago Pain Frequency: Intermittent     Nutritional Risks: None Diabetes: No  How often do you need to have someone help you when you read instructions, pamphlets, or other written materials from your doctor or pharmacy?: 1 - Never What is the last grade level you completed in school?: 11  Interpreter Needed?: No  Information entered by :: Orlie Dakin, LPN  Past Medical History:  Diagnosis Date  . Carotid atherosclerosis, right 11/05/2017  . CKD (chronic kidney disease) stage 3, GFR 30-59 ml/min 11/05/2017  . Essential hypertension 11/05/2017  . Hearing loss due to cerumen impaction, bilateral 11/05/2017  . Hyperlipidemia   . Hypertension   . Pure hypercholesterolemia 11/05/2017  . Screening for diabetes mellitus 11/05/2017  . Stroke ([place])   . 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 HERNIA REPAIR     History reviewed. No pertinent family history. Social History   Socioeconomic History  . Marital status: Widowed    Spouse name: Not on file  . Number of children: 2  . Years of education: 40  . Highest education level: 11th grade  Occupational History  . Occupation: retired    Comment:  postman  Tobacco Use  . Smoking status: Former Smoker    Packs/day: 0.25    Years: 6.00    Pack years: 1.50    Types: Cigarettes  . Smokeless tobacco: Never Used  . Tobacco comment: quit age 11  Substance and Sexual Activity  . Alcohol use: No  . Drug use: No  . Sexual activity: Never  Other Topics Concern  . Not on file  Social History Narrative   Reads daily on his IPAD. Coffee daily. Stays in house mostly due to the COVID virus this year.   Social Determinants of Health   Financial Resource Strain:   .  Difficulty of Paying Living Expenses: Not on file  Food Insecurity:   . Worried About Charity fundraiser in the Last Year: Not on file  . Ran Out of Food in the Last Year: Not on file  Transportation Needs:   . Lack of Transportation (Medical): Not on file  . Lack of Transportation (Non-Medical): Not on file  Physical Activity:   . Days of Exercise per Week: Not on file  . Minutes of Exercise per Session: Not on file  Stress:   . Feeling of Stress : Not on file  Social Connections:   . Frequency of Communication with Friends and Family: Not on file  . Frequency of Social Gatherings with Friends and Family: Not on file  . Attends Religious Services: Not on file  . Active Member of Clubs or Organizations: Not on file  . Attends Archivist Meetings: Not on file  . Marital Status: Not on file    Outpatient Encounter Medications as of 11/24/2019  Medication Sig  . acetaminophen (TYLENOL) 650 MG CR tablet Take by mouth.  . AMBULATORY NON FORMULARY MEDICATION Compression stockings for chronic venous stasis 20-5mmHg.  Marland Kitchen amLODipine (NORVASC) 2.5 MG tablet TAKE 1 TABLET BY MOUTH  DAILY  . apixaban (ELIQUIS) 5 MG TABS tablet Take 1 tablet (5 mg total) by mouth 2 (two) times daily.  Marland Kitchen atorvastatin (LIPITOR) 40 MG tablet Take 1 tablet (40 mg total) by mouth daily. Needs lab work  . Cholecalciferol (VITAMIN D3) 2000 units TABS Take by mouth.  . diclofenac sodium (VOLTAREN) 1 % GEL Apply 4 g topically 4 (four) times daily. To affected joint.  . famotidine (PEPCID) 20 MG tablet Take 1 tablet (20 mg total) by mouth 2 (two) times daily.  . fluticasone (FLONASE) 50 MCG/ACT nasal spray 1 spray by Both Nostrils route daily.  . furosemide (LASIX) 20 MG tablet TAKE 1 TABLET BY MOUTH  DAILY  . ipratropium (ATROVENT) 0.06 % nasal spray Place 2 sprays into both nostrils 4 (four) times daily as needed for rhinitis.  Marland Kitchen lisinopril (ZESTRIL) 20 MG tablet TAKE 1 TABLET BY MOUTH  DAILY  . nystatin  (MYCOSTATIN/NYSTOP) powder Apply topically 3 (three) times daily.  Marland Kitchen nystatin cream (MYCOSTATIN) Apply 1 application topically 2 (two) times daily. To belly button  . tamsulosin (FLOMAX) 0.4 MG CAPS capsule TAKE 1 CAPSULE BY MOUTH  DAILY  . terbinafine (LAMISIL) 250 MG tablet Take 1 tablet (250 mg total) by mouth daily.  Marland Kitchen acetic acid-hydrocortisone (VOSOL-HC) OTIC solution Place 4 drops into the left ear 2 (two) times daily. For 7 days.  . cloNIDine (CATAPRES) 0.1 MG tablet TAKE 1 TABLET BY MOUTH EVERY 6 HOURS AS NEEDED (SBP 170 OR DBP 100) FOR UP TO 30 DAYS.  Marland Kitchen mupirocin ointment (BACTROBAN) 2 % Apply to affected area TID for 7 days. (  Patient not taking: Reported on 11/24/2019)   No facility-administered encounter medications on file as of 11/24/2019.    Activities of Daily Living In your present state of health, do you have any difficulty performing the following activities: 11/24/2019  Hearing? Y  Comment has noticed some hearing loss  Vision? N  Difficulty concentrating or making decisions? N  Walking or climbing stairs? Y  Dressing or bathing? N  Doing errands, shopping? N  Preparing Food and eating ? N  Using the Toilet? N  In the past six months, have you accidently leaked urine? Y  Comment some urinary incontinence does where depends  Do you have problems with loss of bowel control? N  Managing your Medications? N  Managing your Finances? N  Housekeeping or managing your Housekeeping? N  Some recent data might be hidden    Patient Care Team: Lavada Mesi as PCP - General (Family Medicine)   Assessment:   This is a routine wellness examination for Mark Mora.Physical assessment deferred to PCP.   Exercise Activities and Dietary recommendations Current Exercise Habits: The patient does not participate in regular exercise at present, Exercise limited by: orthopedic condition(s) Diet Eats a healthy diet of vegetables fruits and proteins. Breakfast: Raisin bran cereal  with banana juice and coffee Lunch: sometimes skips or a sandwich Dinner: Meat and vegetables. Drinks 48 ounces of water a day.       Goals   None     Fall Risk Fall Risk  11/24/2019 06/25/2019 11/10/2018  Falls in the past year? 0 - 1  Number falls in past yr: 0 - 1  Injury with Fall? 0 - 0  Risk for fall due to : Impaired balance/gait;Impaired mobility Impaired mobility Impaired balance/gait;Impaired mobility;History of fall(s)  Follow up Falls prevention discussed Falls prevention discussed;Falls evaluation completed;Follow up appointment;Education provided Falls prevention discussed   Is the patient's home free of loose throw rugs in walkways, pet beds, electrical cords, etc?   yes      Grab bars in the bathroom? no      Handrails on the stairs?   no      Adequate lighting?   yes   Depression Screen PHQ 2/9 Scores 11/24/2019 11/10/2018 11/05/2017 11/05/2017  PHQ - 2 Score 0 0 0 0  PHQ- 9 Score - - 1 -    Cognitive Function     6CIT Screen 11/24/2019 11/10/2018  What Year? 0 points 0 points  What month? 0 points 0 points  What time? 0 points 0 points  Count back from 20 0 points 0 points  Months in reverse 0 points 0 points  Repeat phrase 0 points 0 points  Total Score 0 0    Immunization History  Administered Date(s) Administered  . Fluad Quad(high Dose 65+) 09/10/2019  . Influenza,inj,Quad PF,6+ Mos 10/09/2018    Screening Tests Health Maintenance  Topic Date Due  . TETANUS/TDAP  07/02/1939  . PNA vac Low Risk Adult (2 of 2 - PPSV23) 12/11/1999  . INFLUENZA VACCINE  Completed        Plan:    Please schedule your next medicare wellness visit with me in 1 yr.  Mark Mora , Thank you for taking time to come for your Medicare Wellness Visit. I appreciate your ongoing commitment to your health goals. Please review the following plan we discussed and let me know if I can assist you in the future.  Continue doing brain stimulating activities (puzzles,  reading, adult coloring books,  staying active) to keep memory sharp.    These are the goals we discussed: Goals   None     This is a list of the screening recommended for you and due dates:  Health Maintenance  Topic Date Due  . Tetanus Vaccine  07/02/1939  . Pneumonia vaccines (2 of 2 - PPSV23) 12/11/1999  . Flu Shot  Completed     I have personally reviewed and noted the following in the patient's chart:   . Medical and social history . Use of alcohol, tobacco or illicit drugs  . Current medications and supplements . Functional ability and status . Nutritional status . Physical activity . Advanced directives . List of other physicians . Hospitalizations, surgeries, and ER visits in previous 12 months . Vitals . Screenings to include cognitive, depression, and falls . Referrals and appointments  In addition, I have reviewed and discussed with patient certain preventive protocols, quality metrics, and best practice recommendations. A written personalized care plan for preventive services as well as general preventive health recommendations were provided to patient.     Joanne Chars, LPN  12/02/4974

## 2019-11-24 ENCOUNTER — Telehealth: Payer: Self-pay | Admitting: Physician Assistant

## 2019-11-24 ENCOUNTER — Ambulatory Visit (INDEPENDENT_AMBULATORY_CARE_PROVIDER_SITE_OTHER): Payer: Medicare Other | Admitting: *Deleted

## 2019-11-24 ENCOUNTER — Other Ambulatory Visit: Payer: Self-pay | Admitting: Sports Medicine

## 2019-11-24 VITALS — BP 108/67 | HR 88 | Ht 65.0 in | Wt 135.0 lb

## 2019-11-24 DIAGNOSIS — Z0000 Encounter for general adult medical examination without abnormal findings: Secondary | ICD-10-CM

## 2019-11-24 MED ORDER — DICLOFENAC SODIUM 1 % EX GEL: 4.0000 g | Freq: Four times a day (QID) | CUTANEOUS | 11 refills | Status: DC

## 2019-11-24 MED ORDER — DICLOFENAC SODIUM 1 % EX GEL: 4.0000 g | Freq: Four times a day (QID) | CUTANEOUS | 11 refills | Status: AC

## 2019-11-24 NOTE — Patient Instructions (Signed)
Please schedule your next medicare wellness visit with me in 1 yr.  Mark Mora , Thank you for taking time to come for your Medicare Wellness Visit. I appreciate your ongoing commitment to your health goals. Please review the following plan we discussed and let me know if I can assist you in the future.  Continue doing brain stimulating activities (puzzles, reading, adult coloring books, staying active) to keep memory sharp.

## 2019-11-24 NOTE — Telephone Encounter (Signed)
Done. Thanks.

## 2019-11-24 NOTE — Telephone Encounter (Signed)
While on the phone doing a wellness visit patient asking for a refill on the Diclofenac gel ( Voltaren). I tried to reorder but it would not let me reorder this med. It states send in new prescription for it. Can you send this in for him

## 2019-11-30 ENCOUNTER — Ambulatory Visit (INDEPENDENT_AMBULATORY_CARE_PROVIDER_SITE_OTHER): Payer: Medicare Other | Admitting: Sports Medicine

## 2019-11-30 ENCOUNTER — Other Ambulatory Visit: Payer: Self-pay

## 2019-11-30 ENCOUNTER — Encounter: Payer: Self-pay | Admitting: Sports Medicine

## 2019-11-30 DIAGNOSIS — N472 Paraphimosis: Secondary | ICD-10-CM | POA: Diagnosis not present

## 2019-11-30 MED ORDER — TRIAMCINOLONE ACETONIDE 0.1 % EX OINT: 1.0000 "application " | TOPICAL_OINTMENT | Freq: Two times a day (BID) | CUTANEOUS | 6 refills | Status: DC

## 2019-11-30 NOTE — Patient Instructions (Signed)
Paraphimosis Paraphimosis is a serious condition that happens when the fold of skin that stretches over the tip of the penis (foreskin) becomes stuck when it is pulled back. This condition blocks blood from flowing away from the penis tip, and that causes swelling that gets worse and worse. Paraphimosis needs to be treated right away. What are the causes? This condition may be caused by:  Leaving the foreskin pulled back after a procedure, such as after the placement of a catheter.  A foreskin that is tighter than normal.  A foreskin that has been pulled back for too long.  A forceful pulling back of the foreskin.  Infection under the foreskin.  Trauma to the area, such as a hard hit to the tip of the penis.  Having sex or masturbating.  Hair or clothing that gets wrapped around the penis. What increases the risk? You are more likely to develop this condition if you:  Have not had all of your foreskin removed.  Have had frequent urological procedures.  Have poor hygiene.  Need help with daily hygiene from a caregiver. What are the signs or symptoms? Symptoms of this condition include:  A foreskin that cannot be returned to its normal position.  Pain, often at the tip of the penis.  Swelling of the penis.  Trouble urinating.  Skin that is red or bluish at the tip of the penis. How is this diagnosed? This condition may be diagnosed based on your symptoms and a physical exam. How is this treated? This condition may be treated with:  A procedure in which your health care provider manually moves the foreskin back into position over the tip of the penis. Swelling may first be reduced by: ? Applying ice to the area. ? Wrapping a bandage tightly around the penis. ? Using needles to drain any pus or blood that is causing the swelling. ? Applying a gauze soaked with certain medicines or solutions. ? Applying granulated sugar to the area. ? Injecting medicine into the  foreskin to reduce swelling.  Medicine to relieve pain. Medicine may be given by mouth, through an IV, or by an injection to the base of the penis (nerve block).  A procedure in which a small incision is made in the tightened foreskin to free it and allow it to be pulled back into place (dorsal slit procedure).  Surgery to remove the foreskin (circumcision). This may be done if the foreskin cannot be moved back into place. Most of the time, treatment can be done in a clinic or a health care provider's office. Follow these instructions at home: Lifestyle  Follow instructions from your health care provider about avoiding sexual activity.  Wear loose undergarments to avoid applying pressure to the area. General instructions  Take over-the-counter and prescription medicines only as told by your health care provider.  Apply any creams to the affected area only as told by your health care provider.  Follow instructions from your health care provider about how to take care of your wound. Make sure you: ? Wash your hands with soap and water before and after you change your bandage (dressing) if you were given one. If soap and water are not available, use hand sanitizer. ? Ask when you should remove your dressing. ? Ask whether the area can get wet.  Keep all follow-up visits as told by your health care provider. This is important. Contact a health care provider if:  The treated area of skin does not heal or  it becomes more irritated, red, or bloody.  Urination is difficult or painful.  Pain in the penis continues, even after you take medicine for pain.  You develop a fever. Summary  Paraphimosis is a serious condition that happens when the fold of skin that stretches over the tip of the penis (foreskin) becomes stuck when it is pulled back. Paraphimosis needs to be treated right away.  Take over-the-counter and prescription medicines only as told by your health care provider.  Apply  any creams to the affected area only as told by your health care provider.  Contact a health care provider if urination is difficult or painful, or if pain in the penis continues even after you take medicine for pain.  Keep all follow-up visits as told by your health care provider. This is important. This information is not intended to replace advice given to you by your health care provider. Make sure you discuss any questions you have with your health care provider. Document Released: 09/23/2009 Document Revised: 05/07/2018 Document Reviewed: 05/07/2018 Elsevier Patient Education  2020 [employer].

## 2019-11-30 NOTE — Assessment & Plan Note (Addendum)
Mild foreskin edema with a bit of reducible paraphimosis. No pain, no itching. Adding topical triamcinolone 0.1% ointment. I would like urology to weigh in.

## 2019-11-30 NOTE — Progress Notes (Signed)
Subjective:    CC: Penile lesion  HPI: Mark Mora is a pleasant 85 year old male, for the past few weeks he is noted some swelling on the left side of his penile foreskin, no trauma, no itching, no pain.  No difficulty voiding.  Symptoms are mild, persistent.  I reviewed the past medical history, family history, social history, surgical history, and allergies today and no changes were needed.  Please see the problem list section below in epic for further details.  Past Medical History: Past Medical History:  Diagnosis Date  . Carotid atherosclerosis, right 11/05/2017  . CKD (chronic kidney disease) stage 3, GFR 30-59 ml/min 11/05/2017  . Essential hypertension 11/05/2017  . Hearing loss due to cerumen impaction, bilateral 11/05/2017  . Hyperlipidemia   . Hypertension   . Pure hypercholesterolemia 11/05/2017  . Screening for diabetes mellitus 11/05/2017  . Stroke ([place])   . Varicose veins of right lower extremity with edema 11/05/2017   Past Surgical History: Past Surgical History:  Procedure Laterality Date  . HERNIA REPAIR     Social History: Social History   Socioeconomic History  . Marital status: Widowed    Spouse name: Not on file  . Number of children: 2  . Years of education: 71  . Highest education level: 11th grade  Occupational History  . Occupation: retired    Comment: postman  Tobacco Use  . Smoking status: Former Smoker    Packs/day: 0.25    Years: 6.00    Pack years: 1.50    Types: Cigarettes  . Smokeless tobacco: Never Used  . Tobacco comment: quit age 15  Substance and Sexual Activity  . Alcohol use: No  . Drug use: No  . Sexual activity: Never  Other Topics Concern  . Not on file  Social History Narrative   Reads daily on his IPAD. Coffee daily. Stays in house mostly due to the COVID virus this year.   Social Determinants of Health   Financial Resource Strain:   . Difficulty of Paying Living Expenses: Not on file  Food Insecurity:   . Worried  About Charity fundraiser in the Last Year: Not on file  . Ran Out of Food in the Last Year: Not on file  Transportation Needs:   . Lack of Transportation (Medical): Not on file  . Lack of Transportation (Non-Medical): Not on file  Physical Activity:   . Days of Exercise per Week: Not on file  . Minutes of Exercise per Session: Not on file  Stress:   . Feeling of Stress : Not on file  Social Connections:   . Frequency of Communication with Friends and Family: Not on file  . Frequency of Social Gatherings with Friends and Family: Not on file  . Attends Religious Services: Not on file  . Active Member of Clubs or Organizations: Not on file  . Attends Archivist Meetings: Not on file  . Marital Status: Not on file   Family History: No family history on file. Allergies: No Known Allergies Medications: See med rec.  Review of Systems: No fevers, chills, night sweats, weight loss, chest pain, or shortness of breath.   Objective:    General: Well Developed, well nourished, and in no acute distress.  Neuro: Alert and oriented x3, extra-ocular muscles intact, sensation grossly intact.  HEENT: Normocephalic, atraumatic, pupils equal round reactive to light, neck supple, no masses, no lymphadenopathy, thyroid nonpalpable.  Skin: Warm and dry, no rashes. Cardiac: Regular rate and rhythm, no  murmurs rubs or gallops, no lower extremity edema.  Respiratory: Clear to auscultation bilaterally. Not using accessory muscles, speaking in full sentences. Genitalia: Testicles are unremarkable to inspection, he does have mild swelling on the left side of his foreskin, there is some difficulty pulling the foreskin over the glans.  Scrotum also is minimally erythematous.  Impression and Recommendations:    Paraphimosis Mild foreskin edema with a bit of reducible paraphimosis. No pain, no itching. Adding topical triamcinolone 0.1% ointment. I would like urology to weigh  in.   ___________________________________________ Gwen Her. Dianah Field, M.D., ABFM., CAQSM. Primary Care and Sports Medicine [facility]  Adjunct Professor of [employer]

## 2019-12-02 ENCOUNTER — Other Ambulatory Visit: Payer: Self-pay

## 2019-12-02 DIAGNOSIS — N472 Paraphimosis: Secondary | ICD-10-CM

## 2019-12-02 MED ORDER — TRIAMCINOLONE ACETONIDE 0.1 % EX OINT: 1.0000 "application " | TOPICAL_OINTMENT | Freq: Two times a day (BID) | CUTANEOUS | 6 refills | Status: AC

## 2019-12-02 NOTE — Telephone Encounter (Signed)
Pharmacy needs RX for Triamcinolone re-sent with specific areas to be treated so they can calculate day supply  RX pended, please specify area to be used on and send

## 2019-12-04 ENCOUNTER — Other Ambulatory Visit: Payer: Self-pay | Admitting: Physician Assistant

## 2019-12-04 DIAGNOSIS — I6521 Occlusion and stenosis of right carotid artery: Secondary | ICD-10-CM

## 2019-12-04 DIAGNOSIS — I10 Essential (primary) hypertension: Secondary | ICD-10-CM

## 2019-12-04 DIAGNOSIS — E7800 Pure hypercholesterolemia, unspecified: Secondary | ICD-10-CM

## 2019-12-15 ENCOUNTER — Other Ambulatory Visit: Payer: Self-pay

## 2019-12-15 DIAGNOSIS — B351 Tinea unguium: Secondary | ICD-10-CM

## 2019-12-15 MED ORDER — TERBINAFINE HCL 250 MG PO TABS: 250.0000 mg | ORAL_TABLET | Freq: Every day | ORAL | 0 refills | Status: AC

## 2019-12-15 NOTE — Telephone Encounter (Signed)
Patient left vm for same request. Wanted to know how you know it is working. He also wanted Soraiya Ahner to know he has had a cold and "stopped up" ears.

## 2019-12-15 NOTE — Telephone Encounter (Signed)
Good normal nail growth at cuticle.  If he has flonase try that and tylenol cold sinus severe. Make sure taking vitamin C and zinc for immune support.

## 2019-12-15 NOTE — Telephone Encounter (Signed)
Mark Mora requests a refill on terbinafine.

## 2019-12-16 NOTE — Telephone Encounter (Signed)
Tried to call patient back to make him aware. Could not talk. Will call back to the office.

## 2019-12-16 NOTE — Telephone Encounter (Signed)
Tried to call again with no answer

## 2019-12-30 ENCOUNTER — Telehealth: Payer: Self-pay

## 2019-12-30 NOTE — Telephone Encounter (Signed)
Mark Mora called about the covid vaccine. Advised her to go to [facility] covid vaccine and sign up for the wait list.   [place] On Tuesday, Jan. 19, [place] and the [facility]) begin large-scale COVID-19 vaccinations at the [place]. The vaccinations are appointment only and for those 15 and older. Walk-ins will NOT be accepted. All appointments are currently filled. Please join our waiting list for the next available appointments. We will contact you when appointments become available. Please do not sign up more than once. Join Our Waiting List

## 2020-01-04 MED ORDER — ATORVASTATIN CALCIUM 40 MG PO TABS
40.00 | ORAL_TABLET | ORAL | Status: DC
Start: 2020-01-03 — End: 2020-01-04

## 2020-01-04 MED ORDER — AMLODIPINE BESYLATE 2.5 MG PO TABS
2.50 | ORAL_TABLET | ORAL | Status: DC
Start: 2020-01-04 — End: 2020-01-04

## 2020-01-04 MED ORDER — APIXABAN 5 MG PO TABS
5.00 | ORAL_TABLET | ORAL | Status: DC
Start: 2020-01-03 — End: 2020-01-04

## 2020-01-04 MED ORDER — NITROGLYCERIN 0.4 MG SL SUBL
0.40 | SUBLINGUAL_TABLET | SUBLINGUAL | Status: DC
Start: ? — End: 2020-01-04

## 2020-01-04 MED ORDER — BENZOCAINE-MENTHOL 15-3.6 MG MT LOZG
1.00 | LOZENGE | OROMUCOSAL | Status: DC
Start: ? — End: 2020-01-04

## 2020-01-04 MED ORDER — TRAMADOL HCL 50 MG PO TABS
50.00 | ORAL_TABLET | ORAL | Status: DC
Start: ? — End: 2020-01-04

## 2020-01-04 MED ORDER — SIMETHICONE 80 MG PO CHEW
80.00 | CHEWABLE_TABLET | ORAL | Status: DC
Start: ? — End: 2020-01-04

## 2020-01-04 MED ORDER — FUROSEMIDE 20 MG PO TABS
20.00 | ORAL_TABLET | ORAL | Status: DC
Start: 2020-01-04 — End: 2020-01-04

## 2020-01-04 MED ORDER — GENERIC EXTERNAL MEDICATION
Status: DC
Start: ? — End: 2020-01-04

## 2020-01-04 MED ORDER — MORPHINE SULFATE 4 MG/ML IJ SOLN
1.00 | INTRAMUSCULAR | Status: DC
Start: ? — End: 2020-01-04

## 2020-01-04 MED ORDER — LISINOPRIL 20 MG PO TABS
20.00 | ORAL_TABLET | ORAL | Status: DC
Start: 2020-01-04 — End: 2020-01-04

## 2020-01-04 MED ORDER — PHENOL 1.4 % MT LIQD
1.00 | OROMUCOSAL | Status: DC
Start: ? — End: 2020-01-04

## 2020-01-04 MED ORDER — TERBINAFINE HCL 250 MG PO TABS
250.00 | ORAL_TABLET | ORAL | Status: DC
Start: 2020-01-04 — End: 2020-01-04

## 2020-01-04 MED ORDER — CLONIDINE HCL 0.1 MG PO TABS
0.10 | ORAL_TABLET | ORAL | Status: DC
Start: ? — End: 2020-01-04

## 2020-01-04 MED ORDER — TAMSULOSIN HCL 0.4 MG PO CAPS
0.40 | ORAL_CAPSULE | ORAL | Status: DC
Start: 2020-01-04 — End: 2020-01-04

## 2020-01-04 MED ORDER — SODIUM CHLORIDE 0.9 % IV SOLN
50.00 | INTRAVENOUS | Status: DC
Start: ? — End: 2020-01-04

## 2020-01-04 MED ORDER — TRIAMCINOLONE ACETONIDE 0.1 % EX OINT
TOPICAL_OINTMENT | CUTANEOUS | Status: DC
Start: 2020-01-03 — End: 2020-01-04

## 2020-01-04 MED ORDER — CHOLECALCIFEROL 25 MCG (1000 UT) PO TABS
1000.00 | ORAL_TABLET | ORAL | Status: DC
Start: 2020-01-04 — End: 2020-01-04

## 2020-01-04 MED ORDER — ACETAMINOPHEN 325 MG PO TABS
650.00 | ORAL_TABLET | ORAL | Status: DC
Start: ? — End: 2020-01-04

## 2020-01-04 MED ORDER — ASCORBIC ACID 500 MG PO TABS
500.00 | ORAL_TABLET | ORAL | Status: DC
Start: 2020-01-04 — End: 2020-01-04

## 2020-01-07 ENCOUNTER — Other Ambulatory Visit: Payer: Self-pay | Admitting: Physician Assistant

## 2020-01-07 DIAGNOSIS — I6300 Cerebral infarction due to thrombosis of unspecified precerebral artery: Secondary | ICD-10-CM

## 2020-01-07 DIAGNOSIS — I2699 Other pulmonary embolism without acute cor pulmonale: Secondary | ICD-10-CM

## 2020-01-07 MED ORDER — APIXABAN 5 MG PO TABS: 5.0000 mg | ORAL_TABLET | Freq: Two times a day (BID) | ORAL | 0 refills | Status: DC

## 2020-01-07 MED ORDER — FAMOTIDINE 20 MG PO TABS: 20.0000 mg | ORAL_TABLET | Freq: Two times a day (BID) | ORAL | 0 refills | Status: AC

## 2020-01-07 NOTE — Telephone Encounter (Signed)
Patient daughter called and let us know that her dad was needing a short term supply of Eliquis and Pepcid sent to local pharmacy.  It has been sent. No other questions.

## 2020-01-29 ENCOUNTER — Ambulatory Visit (INDEPENDENT_AMBULATORY_CARE_PROVIDER_SITE_OTHER): Payer: Medicare Other | Admitting: Physician Assistant

## 2020-01-29 ENCOUNTER — Encounter: Payer: Self-pay | Admitting: Physician Assistant

## 2020-01-29 ENCOUNTER — Other Ambulatory Visit: Payer: Self-pay

## 2020-01-29 VITALS — BP 141/65 | HR 80 | Ht 65.0 in | Wt 135.0 lb

## 2020-01-29 DIAGNOSIS — N472 Paraphimosis: Secondary | ICD-10-CM

## 2020-01-29 DIAGNOSIS — R300 Dysuria: Secondary | ICD-10-CM

## 2020-01-29 DIAGNOSIS — I739 Peripheral vascular disease, unspecified: Secondary | ICD-10-CM | POA: Insufficient documentation

## 2020-01-29 DIAGNOSIS — R42 Dizziness and giddiness: Secondary | ICD-10-CM

## 2020-01-29 LAB — POCT URINALYSIS DIP (CLINITEK)
Bilirubin, UA: NEGATIVE
Glucose, UA: NEGATIVE mg/dL
Ketones, POC UA: NEGATIVE mg/dL
Nitrite, UA: NEGATIVE
POC PROTEIN,UA: 30 — AB
Spec Grav, UA: 1.02 (ref 1.010–1.025)
Urobilinogen, UA: 0.2 E.U./dL
pH, UA: 5.5 (ref 5.0–8.0)

## 2020-01-29 MED ORDER — CIPROFLOXACIN HCL 500 MG PO TABS: 500.0000 mg | ORAL_TABLET | Freq: Two times a day (BID) | ORAL | 0 refills | Status: DC

## 2020-01-29 NOTE — Progress Notes (Signed)
Subjective:    Patient ID: Mark Mora, male    DOB: [birth year], 86 y.o.   MRN: [id number]  HPI  Patient is a 86 year old male who presents with dysuria x 2 days and recurrence of penile foreskin swelling. Denies fever, flank pain, frank hematuria, increased urgency or frequency, penile discharge. Patient had reducible paraphimosis on 12/21 and was giving topical triamcinolone ointment. He states after using the cream the swelling went away but today he noticed mild swelling at the top of penile foreskin. Denies using triamcinolone today.   Patient had left femoral arterial endartectomy on Dec 31 2019.   Patient also complains of 1 episode of ocular vertigo 2 days ago that lasted a few seconds. The patient states he looked down and felt like the tiles on the floor were moving. He denies this symptom returning and denies visual changes, URI symptoms, or HA.   Review of Systems  Constitutional: Negative for chills and fever.  Eyes: Negative for visual disturbance.  Respiratory: Negative for shortness of breath.   Cardiovascular: Negative for chest pain.  Gastrointestinal: Negative for abdominal pain, nausea and vomiting.  Genitourinary: Positive for dysuria and penile swelling. Negative for discharge, flank pain and urgency.  Neurological: Negative for headaches.  All other systems reviewed and are negative.      Objective:   Physical Exam Vitals reviewed.  Constitutional:      Appearance: Normal appearance.  HENT:     Right Ear: Tympanic membrane normal.     Left Ear: Tympanic membrane normal.  Cardiovascular:     Rate and Rhythm: Regular rhythm.     Heart sounds: Normal heart sounds.  Pulmonary:     Breath sounds: Normal breath sounds.  Abdominal:     Tenderness: There is no abdominal tenderness.  Psychiatric:        Mood and Affect: Mood normal.        Behavior: Behavior normal.     .. Results for orders placed or performed in visit on 01/29/20  POCT URINALYSIS DIP  (CLINITEK)  Result Value Ref Range   Color, UA yellow yellow   Clarity, UA clear clear   Glucose, UA negative negative mg/dL   Bilirubin, UA negative negative   Ketones, POC UA negative negative mg/dL   Spec Grav, UA 1.020 1.010 - 1.025   Blood, UA small (A) negative   pH, UA 5.5 5.0 - 8.0   POC PROTEIN,UA =30 (A) negative, trace   Urobilinogen, UA 0.2 0.2 or 1.0 E.U./dL   Nitrite, UA Negative Negative   Leukocytes, UA Large (3+) (A) Negative         Assessment & Plan:  .Govani was seen today for dysuria.  Diagnoses and all orders for this visit:  Dysuria -     ciprofloxacin (CIPRO) 500 MG tablet; Take 1 tablet (500 mg total) by mouth 2 (two) times daily. For 14 days. -     POCT URINALYSIS DIP (CLINITEK) -     Urine Culture  Paraphimosis  Vertigo  PAD (peripheral artery disease) (HCC)  UA positive for blood, protein, leukocytes. Will culture. Pt was recently catherized. Start cipro for 14 days. Will call if no bacteria in culture to stop abx. Call if symptom worsening or not improving.   Needs to see urology for paraphimosis. Continue to use triamcinolone if working. He has not been using because of fear of using too much. Discussed ok to use intermittently but when improves can back off.   PAD-managed  by vascular. Appears to have some residual left leg swelling and hematoma. Use cool compresses.   Vertigo. Seems more ocular. Given exercises to start at home. Only happened once. Stay hydrated. If continues to occur let me know. If any new symptoms let me know.    Spent 30 minutes with patient.   Marland KitchenVernetta Honey PA-C, have reviewed and agree with the above documentation in it's entirety.

## 2020-01-29 NOTE — Patient Instructions (Signed)
Vascular appt 26th.  Warm compresses over left leg.  Continue to use triamcinolone as needed over tip of penis.  Start eye exercises.

## 2020-01-31 LAB — URINE CULTURE
MICRO NUMBER:: 10167904
SPECIMEN QUALITY:: ADEQUATE

## 2020-02-01 ENCOUNTER — Telehealth: Payer: Self-pay

## 2020-02-01 ENCOUNTER — Other Ambulatory Visit: Payer: Self-pay | Admitting: Physician Assistant

## 2020-02-01 MED ORDER — NITROFURANTOIN MONOHYD MACRO 100 MG PO CAPS: 100.0000 mg | ORAL_CAPSULE | Freq: Two times a day (BID) | ORAL | 0 refills | Status: DC

## 2020-02-01 NOTE — Telephone Encounter (Signed)
Caren Griffins called stating that patient has been putting the Triamcinolone cream on genital area and was curious if this could be causing the UTI?

## 2020-02-01 NOTE — Telephone Encounter (Signed)
Patient's daughter advised.  

## 2020-02-01 NOTE — Progress Notes (Signed)
Call patient. You do have infection but cipro will not treat it. Culture came back that you need macrobid. I am sending macrobid to pharmacy. STOP cipro. In 10 days get a urine culture to check for clearance.

## 2020-02-01 NOTE — Telephone Encounter (Signed)
No should not be causing UTI. Bacteria is causing the UTI. Triamcinolone should be applied back more than the head of the penis anyways. I would not use right around opening.

## 2020-02-10 ENCOUNTER — Ambulatory Visit (INDEPENDENT_AMBULATORY_CARE_PROVIDER_SITE_OTHER): Payer: Medicare Other | Admitting: Physician Assistant

## 2020-02-10 ENCOUNTER — Other Ambulatory Visit: Payer: Self-pay

## 2020-02-10 VITALS — BP 122/66 | HR 67 | Ht 65.0 in | Wt 138.0 lb

## 2020-02-10 DIAGNOSIS — N390 Urinary tract infection, site not specified: Secondary | ICD-10-CM

## 2020-02-10 DIAGNOSIS — T83511D Infection and inflammatory reaction due to indwelling urethral catheter, subsequent encounter: Secondary | ICD-10-CM

## 2020-02-10 DIAGNOSIS — I739 Peripheral vascular disease, unspecified: Secondary | ICD-10-CM | POA: Diagnosis not present

## 2020-02-10 DIAGNOSIS — L8941 Pressure ulcer of contiguous site of back, buttock and hip, stage 1: Secondary | ICD-10-CM | POA: Diagnosis not present

## 2020-02-10 DIAGNOSIS — R600 Localized edema: Secondary | ICD-10-CM

## 2020-02-10 LAB — POCT URINALYSIS DIP (CLINITEK)
Bilirubin, UA: NEGATIVE
Blood, UA: NEGATIVE
Glucose, UA: NEGATIVE mg/dL
Ketones, POC UA: NEGATIVE mg/dL
Leukocytes, UA: NEGATIVE
Nitrite, UA: NEGATIVE
POC PROTEIN,UA: NEGATIVE
Spec Grav, UA: 1.015 (ref 1.010–1.025)
Urobilinogen, UA: 0.2 E.U./dL
pH, UA: 5.5 (ref 5.0–8.0)

## 2020-02-10 NOTE — Patient Instructions (Addendum)
Will look into getting a foam matteress approved to go over bed.  Use duoderm every 3 days and clean throughly and pat dry then replace duoderm patch until package is finished.  Use OTC zinc oxide as needed after that.  If pressure sore returning consider OTC duoderm dressings to use as needed.    Pressure Injury  A pressure injury is damage to the skin and underlying tissue that results from pressure being applied to an area of the body. It often affects people who must spend a long time in a bed or chair because of a medical condition. Pressure injuries usually occur:  Over bony parts of the body, such as the tailbone, shoulders, elbows, hips, heels, spine, ankles, and back of the head.  Under medical devices that make contact with the body, such as respiratory equipment, stockings, tubes, and splints. Pressure injuries start as reddened areas on the skin and can lead to pain and an open wound. What are the causes? This condition is caused by frequent or constant pressure to an area of the body. Decreased blood flow to the skin can eventually cause the skin tissue to die and break down, causing a wound. What increases the risk? You are more likely to develop this condition if you:  Are in the hospital or an extended care facility.  Are bedridden or in a wheelchair.  Have an injury or disease that keeps you from: ? Moving normally. ? Feeling pain or pressure.  Have a condition that: ? Makes you sleepy or less alert. ? Causes poor blood flow.  Need to wear a medical device.  Have poor control of your bladder or bowel functions (incontinence).  Have poor nutrition (malnutrition). If you are at risk for pressure injuries, your health care provider may recommend certain types of mattresses, mattress covers, pillows, cushions, or boots to help prevent them. These may include products filled with air, foam, gel, or sand. What are the signs or symptoms? Symptoms of this condition  depend on the severity of the injury. Symptoms may include:  Red or dark areas of the skin.  Pain, warmth, or a change of skin texture.  Blisters.  An open wound. How is this diagnosed? This condition is diagnosed with a medical history and physical exam. You may also have tests, such as:  Blood tests.  Imaging tests.  Blood flow tests. Your pressure injury will be staged based on its severity. Staging is based on:  The depth of the tissue injury, including whether there is exposure of muscle, bone, or tendon.  The cause of the pressure injury. How is this treated? This condition may be treated by:  Relieving or redistributing pressure on your skin. This includes: ? Frequently changing your position. ? Avoiding positions that caused the wound or that can make the wound worse. ? Using specific bed mattresses, chair cushions, or protective boots. ? Moving medical devices from an area of pressure, or placing padding between the skin and the device. ? Using foams, creams, or powders to prevent rubbing (friction) on the skin.  Keeping your skin clean and dry. This may include using a skin cleanser or skin barrier as told by your health care provider.  Cleaning your injury and removing any dead tissue from the wound (debridement).  Placing a bandage (dressing) over your injury.  Using medicines for pain or to prevent or treat infection. Surgery may be needed if other treatments are not working or if your injury is very deep. Follow  these instructions at home: Wound care  Follow instructions from your health care provider about how to take care of your wound. Make sure you: ? Wash your hands with soap and water before and after you change your bandage (dressing). If soap and water are not available, use hand sanitizer. ? Change your dressing as told by your health care provider.  Check your wound every day for signs of infection. Have a caregiver do this for you if you are  not able. Check for: ? Redness, swelling, or increased pain. ? More fluid or blood. ? Warmth. ? Pus or a bad smell. Skin care  Keep your skin clean and dry. Gently pat your skin dry.  Do not rub or massage your skin.  You or a caregiver should check your skin every day for any changes in color or any new blisters or sores (ulcers). Medicines  Take over-the-counter and prescription medicines only as told by your health care provider.  If you were prescribed an antibiotic medicine, take or apply it as told by your health care provider. Do not stop using the antibiotic even if your condition improves. Reducing and redistributing pressure  Do not lie or sit in one position for a long time. Move or change position every 1-2 hours, or as told by your health care provider.  Use pillows or cushions to reduce pressure. Ask your health care provider to recommend cushions or pads for you. General instructions   Eat a healthy diet that includes lots of protein.  Drink enough fluid to keep your urine pale yellow.  Be as active as you can every day. Ask your health care provider to suggest safe exercises or activities.  Do not abuse drugs or alcohol.  Do not use any products that contain nicotine or tobacco, such as cigarettes, e-cigarettes, and chewing tobacco. If you need help quitting, ask your health care provider.  Keep all follow-up visits as told by your health care provider. This is important. Contact a health care provider if:  You have: ? A fever or chills. ? Pain that is not helped by medicine. ? Any changes in skin color. ? New blisters or sores. ? Pus or a bad smell coming from your wound. ? Redness, swelling, or pain around your wound. ? More fluid or blood coming from your wound.  Your wound does not improve after 1-2 weeks of treatment. Summary  A pressure injury is damage to the skin and underlying tissue that results from pressure being applied to an area of the  body.  Do not lie or sit in one position for a long time. Your health care provider may advise you to move or change position every 1-2 hours.  Follow instructions from your health care provider about how to take care of your wound.  Keep all follow-up visits as told by your health care provider. This is important. This information is not intended to replace advice given to you by your health care provider. Make sure you discuss any questions you have with your health care provider. Document Revised: 06/25/2018 Document Reviewed: 06/25/2018 Elsevier Patient Education  [place].

## 2020-02-10 NOTE — Progress Notes (Signed)
Subjective:    Patient ID: Mark Mora, male    DOB: 1920/10/29, 86 y.o.   MRN: [id number]  HPI  Pt is a 86 yo male who presents to the clinic with his daughter to follow up on UTI and discuss "sore" on his buttocks.   He was recently treated for enterococcus after UTI likely from catherization. His urinary symptoms are MUCH better but wants to make sure cleared. No fever, chills, flank or abdominal pain.   Pt continues to follow up with vascular for PAD and endarectomy.   He complains of a painful are just above buttocks that is tender to touch. Daughter has not really looked at it. They have not done a lot with it. They have used some neosporin over it. No fevers. He does sit and lay a lot.   .. Active Ambulatory Problems    Diagnosis Date Noted  . CKD (chronic kidney disease) stage 3, GFR 30-59 ml/min 11/05/2017  . 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 Essential hypertension 11/05/2017  . Carotid atherosclerosis, right 11/05/2017  . Pure hypercholesterolemia 11/05/2017  . Screening for diabetes mellitus 11/05/2017  . Hearing loss due to cerumen impaction, bilateral 11/05/2017  . Toenail fungus 03/20/2018  . Hearing loss of left ear due to cerumen impaction 03/20/2018  . Gastroesophageal reflux disease with esophagitis 03/23/2018  . Benign prostatic hyperplasia with urinary frequency 03/23/2018  . Neuropathy 03/23/2018  . History of prostate cancer 05/05/2018  . Numbness and tingling of both feet 05/05/2018  . Prostate cancer ([place]) 05/23/2018  . Weight loss 05/23/2018  . Spinal stenosis of lumbar region 05/23/2018  . Pulmonary embolus ([place]) 07/15/2018  . Cerebrovascular accident (CVA) due to thrombosis of precerebral artery ([place]) 07/15/2018  . Paresthesias 07/16/2018  . Hypotension 07/16/2018  . Slow transit constipation 07/26/2018  . Bilateral upper abdominal discomfort 07/26/2018  . Burping 07/26/2018  . Opacity of lung on imaging study  07/26/2018  . Centrilobular emphysema ([place]) 08/15/2018  . Hyponatremia 08/15/2018  . Dystrophic nail 09/10/2018  . Bilateral edema of lower extremity 09/10/2018  . Chronic venous stasis dermatitis of both lower extremities 09/10/2018  . Degenerative joint disease of cervical spine 09/26/2018  . Elevated troponin 07/04/2018  . GERD (gastroesophageal reflux disease) 09/26/2018  . Hyperlipemia 09/26/2018  . Hyperglycemia 07/04/2018  . Bilateral impacted cerumen 11/23/2018  . Chronic venous stasis 01/19/2019  . Chronic right shoulder pain 01/19/2019  . Ganglion cyst of volar aspect of left wrist 02/17/2019  . Fatigue 05/15/2019  . Sleeping excessive 05/15/2019  . Paraphimosis 11/30/2019  . PAD (peripheral artery disease) ([place]) 01/29/2020  . Vertigo 01/29/2020   Resolved Ambulatory Problems    Diagnosis Date Noted  . Ingrown toenail of left foot 03/20/2018  . BPH (benign prostatic hyperplasia) 09/26/2018   Past Medical History:  Diagnosis Date  . Hyperlipidemia   . Hypertension   . Stroke [facility])       Review of Systems See HPI.     Objective:   Physical Exam Vitals reviewed.  Constitutional:      Appearance: Normal appearance.  HENT:     Head: Normocephalic.  Cardiovascular:     Rate and Rhythm: Normal rate and regular rhythm.  Pulmonary:     Effort: Pulmonary effort is normal.  Abdominal:     Palpations: Abdomen is soft.     Tenderness: There is no abdominal tenderness. There is no right CVA tenderness or left CVA tenderness.  Musculoskeletal:  Comments: Left worse than right pitting lower extremity edema 2+.   Skin:      Neurological:     General: No focal deficit present.     Mental Status: He is alert.  Psychiatric:        Mood and Affect: Mood normal.           Assessment & Plan:  .Marland KitchenAnay was seen today for cyst.  Diagnoses and all orders for this visit:  Pressure injury of contiguous region involving back, buttock, and hip, stage 1,  unspecified laterality  Urinary tract infection associated with catheterization of urinary tract, unspecified indwelling urinary catheter type, subsequent encounter -     Urine Culture -     POCT URINALYSIS DIP (CLINITEK)  PAD (peripheral artery disease) (HCC)  Bilateral leg edema   Will culture urine to make sure infection has cleared.   Discussed pressure ulcer. Gave duoderm in office to apply every 3 days until pack is done. Keep clean and dry. Try to not sit and lay all day long. Take frequent walks. Lay on side some. Get foam pillow to sit on. Will see about getting a foam matteress for a full bed. Use zinc oxide periodically after done with duoderm. If worsening please call office.   Follow up in 2 weeks.   Continue to take lasix and WEAR compression stockings.    Spent 33 minutes with patient discussing care and treatment plan.

## 2020-02-11 ENCOUNTER — Telehealth: Payer: Self-pay | Admitting: Physician Assistant

## 2020-02-11 LAB — URINE CULTURE
MICRO NUMBER:: 10211204
Result:: NO GROWTH
SPECIMEN QUALITY:: ADEQUATE

## 2020-02-11 NOTE — Telephone Encounter (Signed)
Daughter calls and states that the patch will not stay on her dads back that you gave them. Is there any other suggestions that you have for them. KG LPN

## 2020-02-12 ENCOUNTER — Encounter: Payer: Self-pay | Admitting: Physician Assistant

## 2020-02-12 NOTE — Progress Notes (Signed)
Call pt: urine culture was negative for any bacteria. It has fully cleared.

## 2020-02-12 NOTE — Telephone Encounter (Signed)
Spoke with patient's daughter.  Explained to her how to use patches again. She states she is using duoderm correctly (taking back off). It did stay on last night. I explained to them they can cover if needed. Went over all instructions again in detail - phone time 15 minutes. She is to call with any further questions.

## 2020-02-12 NOTE — Telephone Encounter (Signed)
I gave the duoderm.   I fear she is not taking the last layer off of the pack. Can we walk her through this? This is a pretty sticky material. We could also tell her to get some tegaderm and placed over duoderm.   I showed her in office how to pull off layer but think she may need some assistance.

## 2020-02-15 ENCOUNTER — Telehealth: Payer: Self-pay | Admitting: Neurology

## 2020-02-15 DIAGNOSIS — L8941 Pressure ulcer of contiguous site of back, buttock and hip, stage 1: Secondary | ICD-10-CM | POA: Insufficient documentation

## 2020-02-15 MED ORDER — AMBULATORY NON FORMULARY MEDICATION: 0 refills | Status: DC

## 2020-02-15 NOTE — Telephone Encounter (Signed)
Order for foam mattress, along with office notes and demographics, faxed to [facility] at [phone] with confirmation received.

## 2020-02-16 NOTE — Telephone Encounter (Signed)
Robbie with [employer] [phone]) called to let us know they need more information on foam mattress RX. Called back and LMOM for Robbie to call us and let us know if possible to RX foam overlay for regular mattress, not hospital bed. Awaiting call back.

## 2020-02-17 NOTE — Telephone Encounter (Signed)
Ok let patient know that getting a foam overlay could help. See how he is doing as well. Did they get the duoderm to stick they orginally were having problems.

## 2020-02-17 NOTE — Telephone Encounter (Signed)
Received message back from [place] with [facility] stating foam overlay for any bed that isn't hospital bed isn't billable through insurance. Patient would have to get at normal store. [place].

## 2020-02-18 NOTE — Telephone Encounter (Signed)
Spoke with patient's daughter. She was made aware. She states duoderm working better. Will call with any issues.

## 2020-02-22 ENCOUNTER — Telehealth: Payer: Self-pay

## 2020-02-22 NOTE — Telephone Encounter (Signed)
Mark Mora states he had lab work with Vascular Surgery. He wanted to know if you have reviewed the labs. They are in care everywhere if needed.

## 2020-02-23 NOTE — Telephone Encounter (Signed)
Reviewed vascular note and labs. Kidney function is decreased but stable. If still swelling significantly increase to 40mg  of lasix and lets see you next week for follow up.

## 2020-02-24 ENCOUNTER — Other Ambulatory Visit: Payer: Self-pay

## 2020-02-24 ENCOUNTER — Encounter: Payer: Self-pay | Admitting: Physician Assistant

## 2020-02-24 ENCOUNTER — Ambulatory Visit (INDEPENDENT_AMBULATORY_CARE_PROVIDER_SITE_OTHER): Payer: Medicare Other | Admitting: Physician Assistant

## 2020-02-24 VITALS — BP 132/71 | HR 106 | Ht 65.0 in | Wt 138.0 lb

## 2020-02-24 DIAGNOSIS — R300 Dysuria: Secondary | ICD-10-CM | POA: Diagnosis not present

## 2020-02-24 DIAGNOSIS — N1831 Chronic kidney disease, stage 3a: Secondary | ICD-10-CM | POA: Diagnosis not present

## 2020-02-24 DIAGNOSIS — R600 Localized edema: Secondary | ICD-10-CM

## 2020-02-24 DIAGNOSIS — L89156 Pressure-induced deep tissue damage of sacral region: Secondary | ICD-10-CM

## 2020-02-24 MED ORDER — PHENAZOPYRIDINE HCL 200 MG PO TABS: 200.0000 mg | ORAL_TABLET | Freq: Three times a day (TID) | ORAL | 0 refills | Status: AC

## 2020-02-24 NOTE — Telephone Encounter (Signed)
Daughter advised.

## 2020-02-24 NOTE — Progress Notes (Signed)
Subjective:    Patient ID: Mark Mora, male    DOB: [birth year], 86 y.o.   MRN: [id number]  HPI  Pt is a 86 yo male who presents to the clinic with his daughter to follow up on bilateral lower extremity edema and dysuria.   Pt was seen by vascular on 02/15/2020, Dr. Raul Del. Does not think swelling is due to post-operative state or DVT referred for swelling management with PCP. Pt has not started wearing compression stockings. Swelling better in am and worse at night. Sits a lot during the day. Pt has been taking 1/2 lasix daily of 20mg .   He is using duoderm on ulcer in gluteal crease. Seems to be helping.   His dysuria started back after cleared with last abx treatment. He wants to make sure no infection today. No fever, chills, flank pain.   .. Active Ambulatory Problems    Diagnosis Date Noted  . CKD (chronic kidney disease) stage 3, GFR 30-59 ml/min 11/05/2017  . 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 Essential hypertension 11/05/2017  . Carotid atherosclerosis, right 11/05/2017  . Pure hypercholesterolemia 11/05/2017  . Screening for diabetes mellitus 11/05/2017  . Hearing loss due to cerumen impaction, bilateral 11/05/2017  . Toenail fungus 03/20/2018  . Hearing loss of left ear due to cerumen impaction 03/20/2018  . Gastroesophageal reflux disease with esophagitis 03/23/2018  . Benign prostatic hyperplasia with urinary frequency 03/23/2018  . Neuropathy 03/23/2018  . History of prostate cancer 05/05/2018  . Numbness and tingling of both feet 05/05/2018  . Prostate cancer ([place]) 05/23/2018  . Weight loss 05/23/2018  . Spinal stenosis of lumbar region 05/23/2018  . Pulmonary embolus ([place]) 07/15/2018  . Cerebrovascular accident (CVA) due to thrombosis of precerebral artery ([place]) 07/15/2018  . Paresthesias 07/16/2018  . Hypotension 07/16/2018  . Slow transit constipation 07/26/2018  . Bilateral upper abdominal discomfort 07/26/2018  . Burping  07/26/2018  . Opacity of lung on imaging study 07/26/2018  . Centrilobular emphysema ([place]) 08/15/2018  . Hyponatremia 08/15/2018  . Dystrophic nail 09/10/2018  . Bilateral lower extremity edema 09/10/2018  . Chronic venous stasis dermatitis of both lower extremities 09/10/2018  . Degenerative joint disease of cervical spine 09/26/2018  . Elevated troponin 07/04/2018  . GERD (gastroesophageal reflux disease) 09/26/2018  . Hyperlipemia 09/26/2018  . Hyperglycemia 07/04/2018  . Bilateral impacted cerumen 11/23/2018  . Chronic venous stasis 01/19/2019  . Chronic right shoulder pain 01/19/2019  . Ganglion cyst of volar aspect of left wrist 02/17/2019  . Fatigue 05/15/2019  . Sleeping excessive 05/15/2019  . Paraphimosis 11/30/2019  . PAD (peripheral artery disease) ([place]) 01/29/2020  . Vertigo 01/29/2020  . Pressure injury of contiguous region involving back, buttock, and hip, stage 1 02/15/2020  . Dysuria 03/01/2020  . Pressure injury of deep tissue of sacral region 03/01/2020   Resolved Ambulatory Problems    Diagnosis Date Noted  . Ingrown toenail of left foot 03/20/2018  . BPH (benign prostatic hyperplasia) 09/26/2018   Past Medical History:  Diagnosis Date  . Hyperlipidemia   . Hypertension   . Stroke [facility])      Review of Systems See HPI.     Objective:   Physical Exam Vitals reviewed.  Constitutional:      Appearance: Normal appearance.  Cardiovascular:     Rate and Rhythm: Normal rate and regular rhythm.     Heart sounds: Murmur present.  Pulmonary:     Effort: Pulmonary effort is normal.  Abdominal:  Palpations: Abdomen is soft.     Tenderness: There is no abdominal tenderness.  Musculoskeletal:     Right lower leg: Edema present.     Left lower leg: Edema present.     Comments: 2 + pitting edema bilateral lower leg and feet.   Skin:    Comments: 1cm by 1cm ulceration of superficial layer of skin at the gluteal crease.   Neurological:     Mental  Status: He is alert.  Psychiatric:        Mood and Affect: Mood normal.       .. Results for orders placed or performed in visit on 02/24/20  Urine Culture   Specimen: Urine  Result Value Ref Range   MICRO NUMBER: [id number]    SPECIMEN QUALITY: Adequate    Sample Source NOT GIVEN    STATUS: FINAL    ISOLATE 1:      Less than 10,000 CFU/mL of single Gram negative organism isolated. No further testing will be performed. If clinically indicated, recollection using a method to minimize contamination, with prompt transfer to Urine Culture Transport Tube, is recommended.  POCT URINALYSIS DIP (CLINITEK)  Result Value Ref Range   Color, UA yellow yellow   Clarity, UA clear clear   Glucose, UA negative negative mg/dL   Bilirubin, UA negative negative   Ketones, POC UA negative negative mg/dL   Spec Grav, UA 1.010 1.010 - 1.025   Blood, UA negative negative   pH, UA 6.5 5.0 - 8.0   POC PROTEIN,UA negative negative, trace   Urobilinogen, UA 0.2 0.2 or 1.0 E.U./dL   Nitrite, UA Negative Negative   Leukocytes, UA Negative Negative  POCT UA - Microalbumin  Result Value Ref Range   Microalbumin Ur, POC 10 mg/L   Creatinine, POC 50 mg/dL   Albumin/Creatinine Ratio, Urine, POC 30-300        Assessment & Plan:  .Marland KitchenJustinian was seen today for edema.  Diagnoses and all orders for this visit:  Bilateral lower extremity edema -     Urine Culture  Dysuria -     phenazopyridine (PYRIDIUM) 200 MG tablet; Take 1 tablet (200 mg total) by mouth 3 (three) times daily for 2 days. -     POCT URINALYSIS DIP (CLINITEK) -     POCT UA - Microalbumin -     Urine Culture  Stage 3a chronic kidney disease -     POCT URINALYSIS DIP (CLINITEK) -     POCT UA - Microalbumin -     Urine Culture  Pressure injury of deep tissue of sacral region   Rechecked pressure ulcer seems to be improving. Placed new duoderm patch today. Discussed alertnating positions at home from sitting, laying on sides and then  back.   Serum creatine ws 1.39 last GFR was 48.  Will increase lasix to full tablet of lasix to help get fluid off.  Recheck kidneys in 1 weeks.  GET compression stockings. Pulled up [facility] in clinic and showed her where he can get some. Discussed not getting the tightness due to PVD as well needing some circulation. Decided on 8-15mmHg.   Addendum: Culture negative for bacteria.  He is spilling some protein which is likely causing some swelling as well.  On ACE.  Same treatment plan.

## 2020-02-24 NOTE — Patient Instructions (Addendum)
1 full tablet daily recheck kidneys next Wednesday.  Take pyridium 1 tablet twice a day for 2 days.  duoderm every 3 days for pressure ulcer.

## 2020-02-25 LAB — POCT URINALYSIS DIP (CLINITEK)
Bilirubin, UA: NEGATIVE
Blood, UA: NEGATIVE
Glucose, UA: NEGATIVE mg/dL
Ketones, POC UA: NEGATIVE mg/dL
Leukocytes, UA: NEGATIVE
Nitrite, UA: NEGATIVE
POC PROTEIN,UA: NEGATIVE
Spec Grav, UA: 1.01 (ref 1.010–1.025)
Urobilinogen, UA: 0.2 E.U./dL
pH, UA: 6.5 (ref 5.0–8.0)

## 2020-02-25 LAB — POCT UA - MICROALBUMIN
Creatinine, POC: 50 mg/dL
Microalbumin Ur, POC: 10 mg/L

## 2020-02-26 LAB — URINE CULTURE
MICRO NUMBER:: 10266827
SPECIMEN QUALITY:: ADEQUATE

## 2020-02-26 NOTE — Progress Notes (Signed)
UA looks negative for infection. Will culture to confirm.   He is spilling some protein and supported by his renal function. Likely some of his swelling is due to kidneys not at 100 percent. He is on appropriate medications. Including ace inhibitor and diuretic.

## 2020-02-29 NOTE — Progress Notes (Signed)
No significant bacteria found in urine. If symptoms worsen could consider recollection.

## 2020-03-01 ENCOUNTER — Encounter: Payer: Self-pay | Admitting: Physician Assistant

## 2020-03-01 DIAGNOSIS — L89156 Pressure-induced deep tissue damage of sacral region: Secondary | ICD-10-CM | POA: Insufficient documentation

## 2020-03-01 DIAGNOSIS — R300 Dysuria: Secondary | ICD-10-CM | POA: Insufficient documentation

## 2020-03-02 ENCOUNTER — Ambulatory Visit (INDEPENDENT_AMBULATORY_CARE_PROVIDER_SITE_OTHER): Payer: Medicare Other | Admitting: Physician Assistant

## 2020-03-02 VITALS — BP 104/54 | HR 101 | Ht 65.0 in | Wt 139.0 lb

## 2020-03-02 DIAGNOSIS — R600 Localized edema: Secondary | ICD-10-CM

## 2020-03-02 DIAGNOSIS — N1831 Chronic kidney disease, stage 3a: Secondary | ICD-10-CM

## 2020-03-02 DIAGNOSIS — M48061 Spinal stenosis, lumbar region without neurogenic claudication: Secondary | ICD-10-CM | POA: Diagnosis not present

## 2020-03-02 DIAGNOSIS — I959 Hypotension, unspecified: Secondary | ICD-10-CM

## 2020-03-02 DIAGNOSIS — I739 Peripheral vascular disease, unspecified: Secondary | ICD-10-CM

## 2020-03-02 NOTE — Patient Instructions (Addendum)
Stop norvasc amolodapine.  Continue lasix 20mg  the full tablet daily in the morning.  Hopefully get compression stockings.  Tylenol 650mg  daily tablet every 6 to 8 hours.

## 2020-03-02 NOTE — Progress Notes (Signed)
Subjective:    Patient ID: Mark Mora, male    DOB: [birth year], 86 y.o.   MRN: [id number]  HPI  Pt is a 86 yo male with PAD, CAD, HTN, spinal stenosis with persistent bilateral leg edema.   He continues to have bilateral edema in both legs. He got compression stockings but were too tight. Not wearing any right now. Taking lasix 20mg  daily.   Having more low back pain with radiation into buttocks at time. Harder and harder to walk. Not taking anything for it.   .. Active Ambulatory Problems    Diagnosis Date Noted  . CKD (chronic kidney disease) stage 3, GFR 30-59 ml/min 11/05/2017  . 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 Essential hypertension 11/05/2017  . Carotid atherosclerosis, right 11/05/2017  . Pure hypercholesterolemia 11/05/2017  . Screening for diabetes mellitus 11/05/2017  . Hearing loss due to cerumen impaction, bilateral 11/05/2017  . Toenail fungus 03/20/2018  . Hearing loss of left ear due to cerumen impaction 03/20/2018  . Gastroesophageal reflux disease with esophagitis 03/23/2018  . Benign prostatic hyperplasia with urinary frequency 03/23/2018  . Neuropathy 03/23/2018  . History of prostate cancer 05/05/2018  . Numbness and tingling of both feet 05/05/2018  . Prostate cancer ([place]) 05/23/2018  . Weight loss 05/23/2018  . Spinal stenosis of lumbar region 05/23/2018  . Pulmonary embolus ([place]) 07/15/2018  . Cerebrovascular accident (CVA) due to thrombosis of precerebral artery ([place]) 07/15/2018  . Paresthesias 07/16/2018  . Hypotension 07/16/2018  . Slow transit constipation 07/26/2018  . Bilateral upper abdominal discomfort 07/26/2018  . Burping 07/26/2018  . Opacity of lung on imaging study 07/26/2018  . Centrilobular emphysema ([place]) 08/15/2018  . Hyponatremia 08/15/2018  . Dystrophic nail 09/10/2018  . Bilateral lower extremity edema 09/10/2018  . Chronic venous stasis dermatitis of both lower extremities 09/10/2018  .  Degenerative joint disease of cervical spine 09/26/2018  . Elevated troponin 07/04/2018  . GERD (gastroesophageal reflux disease) 09/26/2018  . Hyperlipemia 09/26/2018  . Hyperglycemia 07/04/2018  . Bilateral impacted cerumen 11/23/2018  . Chronic venous stasis 01/19/2019  . Chronic right shoulder pain 01/19/2019  . Ganglion cyst of volar aspect of left wrist 02/17/2019  . Fatigue 05/15/2019  . Sleeping excessive 05/15/2019  . Paraphimosis 11/30/2019  . PAD (peripheral artery disease) ([place]) 01/29/2020  . Vertigo 01/29/2020  . Pressure injury of contiguous region involving back, buttock, and hip, stage 1 02/15/2020  . Dysuria 03/01/2020  . Pressure injury of deep tissue of sacral region 03/01/2020   Resolved Ambulatory Problems    Diagnosis Date Noted  . Ingrown toenail of left foot 03/20/2018  . BPH (benign prostatic hyperplasia) 09/26/2018   Past Medical History:  Diagnosis Date  . Hyperlipidemia   . Hypertension   . Stroke [facility])        Review of Systems See HPI.     Objective:   Physical Exam Vitals reviewed.  Constitutional:      Appearance: Normal appearance.  Cardiovascular:     Rate and Rhythm: Normal rate and regular rhythm.     Heart sounds: Murmur present.  Pulmonary:     Effort: Pulmonary effort is normal.  Musculoskeletal:     Right lower leg: Edema present.     Left lower leg: Edema present.     Comments: 2 + edema bilateral legs/ankles/feet.   Neurological:     General: No focal deficit present.     Mental Status: He is alert and oriented to  person, place, and time.  Psychiatric:        Mood and Affect: Mood normal.           Assessment & Plan:  .Marland KitchenEivin was seen today for follow-up.  Diagnoses and all orders for this visit:  Hypotension, unspecified hypotension type  Stage 3a chronic kidney disease  Bilateral lower extremity edema  Spinal stenosis of lumbar region, unspecified whether neurogenic claudication present  PAD  (peripheral artery disease) (HCC)   Pt has swelling and on lasix. I do not want to increase lasix due to already low BP. Stop norvasc due to low bP and maybe causing some of this edema.  Discussed lower compression stocking around 53mmHg of pressure.  Keep feet elevated when sitting and laying. Avoid salt.  Kidney function stable on last labs.  Vascular does not see any treatable vascular cause.  Follow up in 2 weeks.   Known spinal stenosis. Cannot take NSAIds. Not taking tylenol. Discussed taking tylenol 2-3 times a day. Pt will start.  Use short term heating pads, icy hot patches and keep changing positions throughout the day.   Follow up in 2 weeks.

## 2020-03-09 ENCOUNTER — Encounter: Payer: Self-pay | Admitting: Physician Assistant

## 2020-03-16 ENCOUNTER — Other Ambulatory Visit: Payer: Self-pay

## 2020-03-16 ENCOUNTER — Ambulatory Visit (INDEPENDENT_AMBULATORY_CARE_PROVIDER_SITE_OTHER): Payer: Medicare Other | Admitting: Physician Assistant

## 2020-03-16 ENCOUNTER — Encounter: Payer: Self-pay | Admitting: Physician Assistant

## 2020-03-16 VITALS — BP 123/60 | HR 60 | Wt 137.1 lb

## 2020-03-16 DIAGNOSIS — I739 Peripheral vascular disease, unspecified: Secondary | ICD-10-CM

## 2020-03-16 DIAGNOSIS — N1832 Chronic kidney disease, stage 3b: Secondary | ICD-10-CM | POA: Diagnosis not present

## 2020-03-16 DIAGNOSIS — G4710 Hypersomnia, unspecified: Secondary | ICD-10-CM | POA: Diagnosis not present

## 2020-03-16 DIAGNOSIS — I959 Hypotension, unspecified: Secondary | ICD-10-CM

## 2020-03-16 DIAGNOSIS — M47892 Other spondylosis, cervical region: Secondary | ICD-10-CM

## 2020-03-16 DIAGNOSIS — R600 Localized edema: Secondary | ICD-10-CM

## 2020-03-16 DIAGNOSIS — K117 Disturbances of salivary secretion: Secondary | ICD-10-CM

## 2020-03-16 DIAGNOSIS — I10 Essential (primary) hypertension: Secondary | ICD-10-CM

## 2020-03-16 NOTE — Progress Notes (Signed)
Pt's daughter states she's concerned about how he's falling asleep.   Noted that he was sitting at the table drinking water and nodded off. She came over to help him and make sure he didn't fall out of the chair.   Pt is concerned about drooling while awake and asleep. No numbness noted.

## 2020-03-17 LAB — COMPLETE METABOLIC PANEL WITH GFR
AG Ratio: 1.6 (calc) (ref 1.0–2.5)
ALT: 13 U/L (ref 9–46)
AST: 19 U/L (ref 10–35)
Albumin: 3.7 g/dL (ref 3.6–5.1)
Alkaline phosphatase (APISO): 40 U/L (ref 35–144)
BUN/Creatinine Ratio: 20 (calc) (ref 6–22)
BUN: 45 mg/dL — ABNORMAL HIGH (ref 7–25)
CO2: 24 mmol/L (ref 20–32)
Calcium: 9.2 mg/dL (ref 8.6–10.3)
Chloride: 95 mmol/L — ABNORMAL LOW (ref 98–110)
Creat: 2.26 mg/dL — ABNORMAL HIGH (ref 0.70–1.11)
GFR, Est African American: 27 mL/min/{1.73_m2} — ABNORMAL LOW (ref >=60)
GFR, Est Non African American: 23 mL/min/{1.73_m2} — ABNORMAL LOW (ref >=60)
Globulin: 2.3 g/dL (calc) (ref 1.9–3.7)
Glucose, Bld: 91 mg/dL (ref 65–139)
Potassium: 5.1 mmol/L (ref 3.5–5.3)
Sodium: 129 mmol/L — ABNORMAL LOW (ref 135–146)
Total Bilirubin: 0.4 mg/dL (ref 0.2–1.2)
Total Protein: 6 g/dL — ABNORMAL LOW (ref 6.1–8.1)

## 2020-03-17 LAB — CBC
HCT: 35.1 % — ABNORMAL LOW (ref 38.5–50.0)
Hemoglobin: 11.8 g/dL — ABNORMAL LOW (ref 13.2–17.1)
MCH: 31.1 pg (ref 27.0–33.0)
MCHC: 33.6 g/dL (ref 32.0–36.0)
MCV: 92.6 fL (ref 80.0–100.0)
MPV: 10.8 fL (ref 7.5–12.5)
Platelets: 217 10*3/uL (ref 140–400)
RBC: 3.79 10*6/uL — ABNORMAL LOW (ref 4.20–5.80)
RDW: 12.3 % (ref 11.0–15.0)
WBC: 8 10*3/uL (ref 3.8–10.8)

## 2020-03-17 LAB — IRON,TIBC AND FERRITIN PANEL
%SAT: 13 % (calc) — ABNORMAL LOW (ref 20–48)
Ferritin: 120 ng/mL (ref 24–380)
Iron: 30 ug/dL — ABNORMAL LOW (ref 50–180)
TIBC: 239 mcg/dL (calc) — ABNORMAL LOW (ref 250–425)

## 2020-03-18 ENCOUNTER — Other Ambulatory Visit: Payer: Self-pay | Admitting: Physician Assistant

## 2020-03-18 ENCOUNTER — Telehealth: Payer: Self-pay

## 2020-03-18 DIAGNOSIS — N184 Chronic kidney disease, stage 4 (severe): Secondary | ICD-10-CM

## 2020-03-18 DIAGNOSIS — G4710 Hypersomnia, unspecified: Secondary | ICD-10-CM | POA: Insufficient documentation

## 2020-03-18 DIAGNOSIS — R600 Localized edema: Secondary | ICD-10-CM

## 2020-03-18 DIAGNOSIS — I739 Peripheral vascular disease, unspecified: Secondary | ICD-10-CM

## 2020-03-18 DIAGNOSIS — K117 Disturbances of salivary secretion: Secondary | ICD-10-CM | POA: Insufficient documentation

## 2020-03-18 MED ORDER — LIDOCAINE 5 % EX PTCH: 1.0000 | MEDICATED_PATCH | Freq: Two times a day (BID) | CUTANEOUS | 2 refills | Status: DC

## 2020-03-18 NOTE — Progress Notes (Signed)
Subjective:    Patient ID: Mark Mora, male    DOB: September 21, 1920, 86 y.o.   MRN: [id number]  HPI  Patient is a 86 year old male with PAD, hypertension, history of stroke, GERD and lower extermity edema.  He is accompanied by his daughter.  Daughter has a few concerns today.  She notices her daughter is sleeping a lot.  He will fall asleep at the table holding glass of water.  At times he seems a little difficult to arouse.  She is also noticed some drooling more but seems to be when he is asleep.  No changes and upper or lower extremity strength.  Denies any numbness and tingling around now, arms, feet, extremities.  .. Active Ambulatory Problems    Diagnosis Date Noted  . CKD (chronic kidney disease) stage 3, GFR 30-59 ml/min 11/05/2017  . 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 Essential hypertension 11/05/2017  . Carotid atherosclerosis, right 11/05/2017  . Pure hypercholesterolemia 11/05/2017  . Screening for diabetes mellitus 11/05/2017  . Hearing loss due to cerumen impaction, bilateral 11/05/2017  . Toenail fungus 03/20/2018  . Hearing loss of left ear due to cerumen impaction 03/20/2018  . Gastroesophageal reflux disease with esophagitis 03/23/2018  . Benign prostatic hyperplasia with urinary frequency 03/23/2018  . Neuropathy 03/23/2018  . History of prostate cancer 05/05/2018  . Numbness and tingling of both feet 05/05/2018  . Prostate cancer ([place]) 05/23/2018  . Weight loss 05/23/2018  . Spinal stenosis of lumbar region 05/23/2018  . Pulmonary embolus ([place]) 07/15/2018  . Cerebrovascular accident (CVA) due to thrombosis of precerebral artery ([place]) 07/15/2018  . Paresthesias 07/16/2018  . Hypotension 07/16/2018  . Slow transit constipation 07/26/2018  . Bilateral upper abdominal discomfort 07/26/2018  . Burping 07/26/2018  . Opacity of lung on imaging study 07/26/2018  . Centrilobular emphysema ([place]) 08/15/2018  . Hyponatremia 08/15/2018  .  Dystrophic nail 09/10/2018  . Bilateral lower extremity edema 09/10/2018  . Chronic venous stasis dermatitis of both lower extremities 09/10/2018  . Degenerative joint disease of cervical spine 09/26/2018  . Elevated troponin 07/04/2018  . GERD (gastroesophageal reflux disease) 09/26/2018  . Hyperlipemia 09/26/2018  . Hyperglycemia 07/04/2018  . Bilateral impacted cerumen 11/23/2018  . Chronic venous stasis 01/19/2019  . Chronic right shoulder pain 01/19/2019  . Ganglion cyst of volar aspect of left wrist 02/17/2019  . Fatigue 05/15/2019  . Sleeping excessive 05/15/2019  . Paraphimosis 11/30/2019  . PAD (peripheral artery disease) ([place]) 01/29/2020  . Vertigo 01/29/2020  . Pressure injury of contiguous region involving back, buttock, and hip, stage 1 02/15/2020  . Dysuria 03/01/2020  . Pressure injury of deep tissue of sacral region 03/01/2020   Resolved Ambulatory Problems    Diagnosis Date Noted  . Ingrown toenail of left foot 03/20/2018  . BPH (benign prostatic hyperplasia) 09/26/2018   Past Medical History:  Diagnosis Date  . Hyperlipidemia   . Hypertension   . Stroke [facility])      Review of Systems See HPI.     Objective:   Physical Exam Vitals reviewed.  Constitutional:      Appearance: Normal appearance.  HENT:     Head: Normocephalic.  Cardiovascular:     Rate and Rhythm: Normal rate.     Heart sounds: Murmur present.  Pulmonary:     Effort: Pulmonary effort is normal.     Breath sounds: Normal breath sounds.  Musculoskeletal:     Right lower leg: Edema present.  Left lower leg: Edema present.     Comments: 2+ bilateral edema of feet and legs.   Neurological:     General: No focal deficit present.     Mental Status: He is alert and oriented to person, place, and time.  Psychiatric:        Mood and Affect: Mood normal.           Assessment & Plan:  Marland KitchenMarland KitchenDiagnoses and all orders for this visit:  Excessive sleepiness -     CBC -      Fe+TIBC+Fer -     COMPLETE METABOLIC PANEL WITH GFR  Essential hypertension -     COMPLETE METABOLIC PANEL WITH GFR  Hypotension, unspecified hypotension type  Stage 3b chronic kidney disease -     COMPLETE METABOLIC PANEL WITH GFR  Other osteoarthritis of spine, cervical region  Bilateral lower extremity edema -     COMPLETE METABOLIC PANEL WITH GFR  PAD (peripheral artery disease) (HCC)  Drooling   Spent some time discussing sleep patterns as we age.  Certainly he will begin to be more more fatigued.  Is not an reasonable for him to be sleeping a lot throughout the day.  We will check some labs just to make sure there is nothing acute going on.  Patient does have chronic kidney disease.  It has been fairly stable.  We will recheck this today.  I am not alarmed by drawing since it seems to be accompanied when he is asleep.  Patient has frequent follow-ups with vascular.  Patient continues to have bilateral lower leg edema.  He has not gotten the 8 mg per mercury compression socks at this time.  He is taking Lasix 1 full tab daily.   bP is doing great off norvasc and swelling seems to be a hair better.   Pt continues to complain of neck and back pain. Discussed tylenol regularly. Consider icy hot patches for pain relief. Consider neck support if easily falling asleep.   Spent 30 minutes with patient.   Follow up in 7 weeks.

## 2020-03-18 NOTE — Progress Notes (Signed)
Call patient:  Hemoglobin stable.  Your kidney function has worsened some. You need to cut back lasix to 1/2 tablet daily again. I am also going to make referral to nephrology at this point since GFR is 27.

## 2020-03-18 NOTE — Telephone Encounter (Signed)
Ok sent lidoderm patches.

## 2020-03-18 NOTE — Telephone Encounter (Signed)
Mark Mora called and states the pain patch did work. She wanted a prescription. She states it is not the icy hot patch. Please advise.

## 2020-03-21 MED ORDER — GENERIC EXTERNAL MEDICATION
Status: DC
Start: ? — End: 2020-03-21

## 2020-03-21 MED ORDER — SODIUM CHLORIDE 0.9 % IV SOLN
10.00 | INTRAVENOUS | Status: DC
Start: ? — End: 2020-03-21

## 2020-03-21 MED ORDER — TAMSULOSIN HCL 0.4 MG PO CAPS
0.40 | ORAL_CAPSULE | ORAL | Status: DC
Start: 2020-03-24 — End: 2020-03-21

## 2020-03-21 MED ORDER — LABETALOL HCL 5 MG/ML IV SOLN
20.00 | INTRAVENOUS | Status: DC
Start: ? — End: 2020-03-21

## 2020-03-21 MED ORDER — POLYETHYLENE GLYCOL 3350 17 GM/SCOOP PO POWD
17.00 | ORAL | Status: DC
Start: ? — End: 2020-03-21

## 2020-03-21 MED ORDER — ATORVASTATIN CALCIUM 40 MG PO TABS
40.00 | ORAL_TABLET | ORAL | Status: DC
Start: 2020-03-24 — End: 2020-03-21

## 2020-03-21 MED ORDER — MORPHINE SULFATE 4 MG/ML IJ SOLN
4.00 | INTRAMUSCULAR | Status: DC
Start: ? — End: 2020-03-21

## 2020-03-21 MED ORDER — GUAIFENESIN 100 MG/5ML PO SYRP
200.00 | ORAL_SOLUTION | ORAL | Status: DC
Start: ? — End: 2020-03-21

## 2020-03-21 MED ORDER — LISINOPRIL 20 MG PO TABS
20.00 | ORAL_TABLET | ORAL | Status: DC
Start: 2020-03-24 — End: 2020-03-21

## 2020-03-21 MED ORDER — HYDROCODONE-ACETAMINOPHEN 5-325 MG PO TABS
1.00 | ORAL_TABLET | ORAL | Status: DC
Start: ? — End: 2020-03-21

## 2020-03-21 MED ORDER — ENOXAPARIN SODIUM 30 MG/0.3ML ~~LOC~~ SOLN
30.00 | SUBCUTANEOUS | Status: DC
Start: 2020-03-22 — End: 2020-03-21

## 2020-03-21 MED ORDER — AMLODIPINE BESYLATE 2.5 MG PO TABS
2.50 | ORAL_TABLET | ORAL | Status: DC
Start: 2020-03-24 — End: 2020-03-21

## 2020-03-21 MED ORDER — FAMOTIDINE 20 MG PO TABS
20.00 | ORAL_TABLET | ORAL | Status: DC
Start: 2020-03-24 — End: 2020-03-21

## 2020-03-21 MED ORDER — LIDOCAINE-MENTHOL 3.6-1.25 % EX PTCH
1.00 | MEDICATED_PATCH | CUTANEOUS | Status: DC
Start: 2020-03-24 — End: 2020-03-21

## 2020-03-21 NOTE — Telephone Encounter (Signed)
Patient's daughter advised. He is currently in [place].

## 2020-03-22 MED ORDER — LIDOCAINE 5 % EX PTCH: 1.0000 | MEDICATED_PATCH | Freq: Two times a day (BID) | CUTANEOUS | 2 refills | Status: AC | PRN

## 2020-03-24 MED ORDER — APIXABAN 5 MG PO TABS
5.00 | ORAL_TABLET | ORAL | Status: DC
Start: 2020-03-23 — End: 2020-03-24

## 2020-03-24 MED ORDER — GENERIC EXTERNAL MEDICATION
Status: DC
Start: ? — End: 2020-03-24

## 2020-03-24 MED ORDER — LINEZOLID 600 MG/300ML IV SOLN
600.00 | INTRAVENOUS | Status: DC
Start: 2020-03-23 — End: 2020-03-24

## 2020-03-25 MED ORDER — GENERIC EXTERNAL MEDICATION
Status: DC
Start: ? — End: 2020-03-25

## 2020-03-29 ENCOUNTER — Ambulatory Visit (INDEPENDENT_AMBULATORY_CARE_PROVIDER_SITE_OTHER): Payer: Medicare Other | Admitting: Physician Assistant

## 2020-03-29 ENCOUNTER — Other Ambulatory Visit: Payer: Self-pay

## 2020-03-29 VITALS — BP 117/71 | HR 88 | Ht 65.0 in

## 2020-03-29 DIAGNOSIS — I2699 Other pulmonary embolism without acute cor pulmonale: Secondary | ICD-10-CM | POA: Diagnosis not present

## 2020-03-29 DIAGNOSIS — N472 Paraphimosis: Secondary | ICD-10-CM

## 2020-03-29 DIAGNOSIS — N179 Acute kidney failure, unspecified: Secondary | ICD-10-CM

## 2020-03-29 DIAGNOSIS — R600 Localized edema: Secondary | ICD-10-CM

## 2020-03-29 DIAGNOSIS — I739 Peripheral vascular disease, unspecified: Secondary | ICD-10-CM

## 2020-03-29 DIAGNOSIS — L97521 Non-pressure chronic ulcer of other part of left foot limited to breakdown of skin: Secondary | ICD-10-CM

## 2020-03-29 DIAGNOSIS — L03116 Cellulitis of left lower limb: Secondary | ICD-10-CM

## 2020-03-29 LAB — BASIC METABOLIC PANEL WITH GFR
BUN/Creatinine Ratio: 19 (calc) (ref 6–22)
BUN: 30 mg/dL — ABNORMAL HIGH (ref 7–25)
CO2: 24 mmol/L (ref 20–32)
Calcium: 8.5 mg/dL — ABNORMAL LOW (ref 8.6–10.3)
Chloride: 95 mmol/L — ABNORMAL LOW (ref 98–110)
Creat: 1.56 mg/dL — ABNORMAL HIGH (ref 0.70–1.11)
GFR, Est African American: 43 mL/min/{1.73_m2} — ABNORMAL LOW (ref >=60)
GFR, Est Non African American: 37 mL/min/{1.73_m2} — ABNORMAL LOW (ref >=60)
Glucose, Bld: 77 mg/dL (ref 65–99)
Potassium: 5.4 mmol/L — ABNORMAL HIGH (ref 3.5–5.3)
Sodium: 127 mmol/L — ABNORMAL LOW (ref 135–146)

## 2020-03-29 NOTE — Progress Notes (Signed)
Subjective:    Patient ID: Mark Mora, male    DOB: 1920-10-31, 86 y.o.   MRN: [id number]  HPI  Pt is a 86 yo male who presents to the clinic with his daughter for hospital follow up.   Pt has had increased problems with swelling since his endarectomy of left common femoral artery in January 2021. He has known hematoma and has been drained but not intervention has been made in numerous weeks.    On 4/9 went to ED for left leg weakness and swelling.   Admit 4/9 Discharge 4/14 Dx include: Weakness left leg AKI Spinal stenosis hyponateremia  Hospital Course:  1. Pt w/ c/o LLE weakness but not so much true weakness as anterior inguinal and thigh edema and pain limiting full ROM in 86yo male. He does have a known h/o spinal stenosis which would also be contributing. MRI lumbar spine showed advanced multilevel degenerative changes of the lumbar spine, central stenosis most severe at L3-L4. There were varying degrees of severe bilateral foraminal stenosis throughout the lumbar spine. No acute fracture. He did well with PT. Patient and daughter wereconcerned regarding worsening swelling of known left groin hematoma. Pt underwent CT pelvis and findings of both CT pelvis and LE venous ultrasound were reviewed with Dr. Rachell Cipro (vasc surgery) over the phone on 4/11 by the prior hospitalist. He did not think any further workup was necessaryand that hematoma was NOT the cause of his weakness. He has clinic in [place] on Wed and would be happy to see patient in follow-up. Appt was arranged for 04/06/20. However, on the day prior to discharge there was quite a bit of edema as well as increased erythema, warmth and TTP over the left thigh and given increasing WBC did start abx to cover cellulitis. also checked BCx2 which are pending and will ask PCP and vascular surgery to assist in confirming that they remain negative. Pts dtr requested to delay discharge until wound care could see him for known  vascular wound to the great toe and pre-existing sacral wound. Wound care recommendations to continue painting toe w/ betadine. Will refer to wound care, outpt PT. he has f/u planned as noted above. Will continue zyvox until he is able to be seen by vascular surgery to re-evaluate on the 28th.   2.AKI (acute kidney injury)on CKD stage IV-Creat improved w/ IVF. ACE was initially held but later resumed due to elevated BP elevated and Cr continued to improve.  3.Hypertension-stable after resuming ACE  4. Leukocytosis-possibly reactive. Procalcitonin minimally elevated at 0.18.reviewed hx for steroid use and none identified. H/o phimosis w/ prn topical steroid use only and no abnormality on exam, no concern for acute GU infection or significant phimosis. Left thigh was concerning for a secondary cellulitis and did start zyvox to cover as noted above. BCx2 pending as well. Denies fevers, cough, SOB. UA negative infection.   No growth of blood cultures. He continues to take linezolid.   appts at discharge:   Wound clinic-23rd Friday.  PT-not scheduled. vascular- April 28th Podiatry 29th. nephrologist not yet scheduled.    .. Active Ambulatory Problems    Diagnosis Date Noted  . CKD (chronic kidney disease) stage 3, GFR 30-59 ml/min 11/05/2017  . 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 Essential hypertension 11/05/2017  . Carotid atherosclerosis, right 11/05/2017  . Pure hypercholesterolemia 11/05/2017  . Screening for diabetes mellitus 11/05/2017  . Hearing loss due to cerumen impaction, bilateral 11/05/2017  . Toenail  fungus 03/20/2018  . Hearing loss of left ear due to cerumen impaction 03/20/2018  . Gastroesophageal reflux disease with esophagitis 03/23/2018  . Benign prostatic hyperplasia with urinary frequency 03/23/2018  . Neuropathy 03/23/2018  . History of prostate cancer 05/05/2018  . Numbness and tingling of both feet 05/05/2018  . Prostate  cancer ([place]) 05/23/2018  . Weight loss 05/23/2018  . Spinal stenosis of lumbar region 05/23/2018  . Pulmonary embolus ([place]) 07/15/2018  . Cerebrovascular accident (CVA) due to thrombosis of precerebral artery ([place]) 07/15/2018  . Paresthesias 07/16/2018  . Hypotension 07/16/2018  . Slow transit constipation 07/26/2018  . Bilateral upper abdominal discomfort 07/26/2018  . Burping 07/26/2018  . Opacity of lung on imaging study 07/26/2018  . Centrilobular emphysema ([place]) 08/15/2018  . Hyponatremia 08/15/2018  . Dystrophic nail 09/10/2018  . Bilateral lower extremity edema 09/10/2018  . Chronic venous stasis dermatitis of both lower extremities 09/10/2018  . Degenerative joint disease of cervical spine 09/26/2018  . Elevated troponin 07/04/2018  . GERD (gastroesophageal reflux disease) 09/26/2018  . Hyperlipemia 09/26/2018  . Hyperglycemia 07/04/2018  . Bilateral impacted cerumen 11/23/2018  . Chronic venous stasis 01/19/2019  . Chronic right shoulder pain 01/19/2019  . Ganglion cyst of volar aspect of left wrist 02/17/2019  . Fatigue 05/15/2019  . Sleeping excessive 05/15/2019  . Paraphimosis 11/30/2019  . PAD (peripheral artery disease) ([place]) 01/29/2020  . Vertigo 01/29/2020  . Pressure injury of contiguous region involving back, buttock, and hip, stage 1 02/15/2020  . Dysuria 03/01/2020  . Pressure injury of deep tissue of sacral region 03/01/2020  . Drooling 03/18/2020  . Excessive sleepiness 03/18/2020  . Ischemic ulcer of toe of left foot, limited to breakdown of skin ([place]) 04/04/2020   Resolved Ambulatory Problems    Diagnosis Date Noted  . Ingrown toenail of left foot 03/20/2018  . BPH (benign prostatic hyperplasia) 09/26/2018   Past Medical History:  Diagnosis Date  . Hyperlipidemia   . Hypertension   . Stroke [facility])      Review of Systems See HPI.     Objective:   Physical Exam Vitals reviewed.  Constitutional:      Appearance: Normal appearance.  HENT:      Head: Normocephalic.  Cardiovascular:     Rate and Rhythm: Normal rate and regular rhythm.     Pulses: Normal pulses.     Heart sounds: Murmur present.  Pulmonary:     Effort: Pulmonary effort is normal.     Breath sounds: Normal breath sounds.  Musculoskeletal:     Cervical back: Normal range of motion.     Right lower leg: Edema present.     Left lower leg: Edema present.     Comments: Right leg/calf 1 plus edema.  Left leg/calf 3 plus edema.   Left leg arm upper thigh warm to touch.   Neurological:     General: No focal deficit present.     Mental Status: He is alert.  Psychiatric:        Mood and Affect: Mood normal.           Assessment & Plan:  .Marland KitchenDartagnan was seen today for hospitalization follow-up.  Diagnoses and all orders for this visit:  Cellulitis of left thigh -     BASIC METABOLIC PANEL WITH GFR  AKI (acute kidney injury) ([place]) -     BASIC METABOLIC PANEL WITH GFR  PAD (peripheral artery disease) ([facility])  Other acute pulmonary embolism without acute cor pulmonale ([facility]) -  apixaban (ELIQUIS) 2.5 MG TABS tablet; Take 1 tablet (2.5 mg total) by mouth 2 (two) times daily.  Bilateral lower extremity edema -     furosemide (LASIX) 20 MG tablet; Take every other day for bilateral lower extremity edema.  Paraphimosis -     Ambulatory referral to Urology  Ischemic ulcer of toe of left foot, limited to breakdown of skin ([place])   Recheck BMP and kidney function today.  Continue ABX for cellulitis of left leg. Reassured patient that blood cultures were negative from hospital.  Keep appt with vascular to see if any treatment can be done on hematoma in left leg.  Keep feet elevated and use very light compression if can for swelling.  Continue with wound care for left toe ulcer.   Called patient and daughter with labs> reviewed with Dr. Jerilynn Mages as well.   AKI- GFR improved after stopping lasix in hospital to 43. Since patient has so much fluid will start lasix  20mg  EVERY OTHER day and recheck with Dr. Jerilynn Mages in 1 week to see how kidney function responds.   Due to GFR decreased eliquis to a renal appropriate dose. Pt and pt daughter aware.   Sodium still low. Increase salt in diet with chips/crackers etc. Recheck in 1 week.

## 2020-03-30 MED ORDER — APIXABAN 2.5 MG PO TABS: 2.5000 mg | ORAL_TABLET | Freq: Two times a day (BID) | ORAL | 5 refills | Status: DC

## 2020-03-30 MED ORDER — FUROSEMIDE 20 MG PO TABS: ORAL_TABLET | ORAL | 1 refills | Status: DC

## 2020-03-30 NOTE — Progress Notes (Signed)
Call patient spoke with him. Daughter not present called but no answer. Would like to speak with daughter as well.   Kidney function better than 2 weeks ago.  Sodium still low. Increase salt in diet.  Potassium was high.  Lasix 20mg  every other day.   Will renally dose eliqus 2.5mg  twice a day.   Recheck with Dr. Madilyn Fireman in one week.

## 2020-04-01 ENCOUNTER — Encounter: Payer: Self-pay | Admitting: Physician Assistant

## 2020-04-04 DIAGNOSIS — L97521 Non-pressure chronic ulcer of other part of left foot limited to breakdown of skin: Secondary | ICD-10-CM | POA: Insufficient documentation

## 2020-04-05 ENCOUNTER — Telehealth: Payer: Self-pay

## 2020-04-05 NOTE — Telephone Encounter (Signed)
Caren Griffins called and states her dad is having sinus pressure and allergy symptoms. He is taking an allergy medication with little relief. She wanted to know if it would be ok to take Sinus 12 hour OTC with Sudafed. Please advise.

## 2020-04-05 NOTE — Telephone Encounter (Signed)
I would suggest he try something like Coricidin HBP for his symptoms instead of a medication with Sudafed in it. The Sudafed can cause an increase in heart rate and blood pressure and is not generally recommended for older adults.

## 2020-04-06 NOTE — Telephone Encounter (Signed)
Mark Mora advised of recommendations. She states the ED/Hospital discontinued the Coricidin during last visit. I advised to not take medication if discontinued. Also to follow up with Dr Madilyn Fireman during up coming appointment.

## 2020-04-07 ENCOUNTER — Other Ambulatory Visit: Payer: Self-pay

## 2020-04-07 ENCOUNTER — Ambulatory Visit (INDEPENDENT_AMBULATORY_CARE_PROVIDER_SITE_OTHER): Payer: Medicare Other | Admitting: Family Medicine

## 2020-04-07 ENCOUNTER — Encounter: Payer: Self-pay | Admitting: Family Medicine

## 2020-04-07 VITALS — BP 142/62 | HR 79 | Temp 98.2°F | Ht 64.96 in

## 2020-04-07 DIAGNOSIS — R600 Localized edema: Secondary | ICD-10-CM

## 2020-04-07 DIAGNOSIS — E875 Hyperkalemia: Secondary | ICD-10-CM

## 2020-04-07 DIAGNOSIS — E871 Hypo-osmolality and hyponatremia: Secondary | ICD-10-CM

## 2020-04-07 DIAGNOSIS — R918 Other nonspecific abnormal finding of lung field: Secondary | ICD-10-CM

## 2020-04-07 DIAGNOSIS — R630 Anorexia: Secondary | ICD-10-CM

## 2020-04-07 DIAGNOSIS — R110 Nausea: Secondary | ICD-10-CM | POA: Diagnosis not present

## 2020-04-07 DIAGNOSIS — R1013 Epigastric pain: Secondary | ICD-10-CM

## 2020-04-07 DIAGNOSIS — R29898 Other symptoms and signs involving the musculoskeletal system: Secondary | ICD-10-CM

## 2020-04-07 DIAGNOSIS — R911 Solitary pulmonary nodule: Secondary | ICD-10-CM

## 2020-04-07 NOTE — Progress Notes (Signed)
Established Patient Office Visit  Subjective:  Patient ID: Mark Mora, male    DOB: 24-May-1920  Age: 86 y.o. MRN: [id number]  CC: No chief complaint on file.   HPI [name] presents for   Follow-up of chronic lower extremity swelling for about the last 6 months.  It is so severe that it is causing his legs to be incredibly heavy to the point that it is actually affecting his ability to be mobile.  He feels like he is actually getting more weak.  He was restarted on Lasix every other day about a week ago.  He says he really has not noticed a big improvement in his swelling.  He does notice increased urination.  And he always reports that the swelling is better first thing in the morning though it is never completely gone but it is better.  He has not been able to pick up low-grade compression stockings between 8 to 12 cm water pressure.  Because of his peripheral vascular disease he cannot wear anything more compressive than that.  He does try to elevate his feet quite frequently throughout the day    He has felt nauseated for the last 3 weeks. Not taking the Ensure or protein shakes.  Having more epigastric pain.  Says it feels like "indigestion".  He says its not daily it is just every now and again but it does sometimes make him not want to eat.  His daughter has noticed that he is not interested in eating much she feels like his appetite has significantly declined in the last couple of weeks.  Just saw vascular surgeon yesterday for PVD.  They did discuss with him that he does have improved blood flow since the surgery but they would not be able to get any further improvement than what he is already experiencing.  They did not have any recommendations for his diffuse and significant lower extremity swelling.  In regards to his foot ulcer they felt that it will be very difficult to heal and that he will probably just have to continue wound care indefinitely and or consider amputation  because of the severe PVD.  Hyponatremia-recent labs showed low sodium levels.  He has been trying to get more salt rich foods in the diet.    Past Medical History:  Diagnosis Date  . Carotid atherosclerosis, right 11/05/2017  . CKD (chronic kidney disease) stage 3, GFR 30-59 ml/min 11/05/2017  . Essential hypertension 11/05/2017  . Hearing loss due to cerumen impaction, bilateral 11/05/2017  . Hyperlipidemia   . Hypertension   . Pure hypercholesterolemia 11/05/2017  . Screening for diabetes mellitus 11/05/2017  . Stroke ([place])   . 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 HERNIA REPAIR      No family history on file.  Social History   Socioeconomic History  . Marital status: Widowed    Spouse name: Not on file  . Number of children: 2  . Years of education: 11  . Highest education level: 11th grade  Occupational History  . Occupation: retired    Comment: postman  Tobacco Use  . Smoking status: Former Smoker    Packs/day: 0.25    Years: 6.00    Pack years: 1.50    Types: Cigarettes  . Smokeless tobacco: Never Used  . Tobacco comment: quit age 34  Substance and Sexual Activity  . Alcohol use: No  . Drug  use: No  . Sexual activity: Never  Other Topics Concern  . Not on file  Social History Narrative   Reads daily on his IPAD. Coffee daily. Stays in house mostly due to the COVID virus this year.   Social Determinants of Health   Financial Resource Strain:   . Difficulty of Paying Living Expenses:   Food Insecurity:   . Worried About Charity fundraiser in the Last Year:   . Arboriculturist in the Last Year:   Transportation Needs:   . Film/video editor (Medical):   Marland Kitchen Lack of Transportation (Non-Medical):   Physical Activity:   . Days of Exercise per Week:   . Minutes of Exercise per Session:   Stress:   . Feeling of Stress :   Social Connections:   . Frequency of  Communication with Friends and Family:   . Frequency of Social Gatherings with Friends and Family:   . Attends Religious Services:   . Active Member of Clubs or Organizations:   . Attends Archivist Meetings:   Marland Kitchen Marital Status:   Intimate Partner Violence:   . Fear of Current or Ex-Partner:   . Emotionally Abused:   Marland Kitchen Physically Abused:   . Sexually Abused:     Outpatient Medications Prior to Visit  Medication Sig Dispense Refill  . acetaminophen (TYLENOL) 650 MG CR tablet Take by mouth.    . AMBULATORY NON FORMULARY MEDICATION Compression stockings for chronic venous stasis 20-4mmHg. 2 Device 0  . apixaban (ELIQUIS) 2.5 MG TABS tablet Take 1 tablet (2.5 mg total) by mouth 2 (two) times daily. 60 tablet 5  . atorvastatin (LIPITOR) 40 MG tablet TAKE 1 TABLET BY MOUTH  DAILY. NEEDS LAB WORK 90 tablet 3  . Cholecalciferol (VITAMIN D3) 2000 units TABS Take by mouth.    . diclofenac Sodium (VOLTAREN) 1 % GEL Apply 4 g topically 4 (four) times daily. To affected joint. 350 g 11  . famotidine (PEPCID) 20 MG tablet Take 1 tablet (20 mg total) by mouth 2 (two) times daily. 15 tablet 0  . furosemide (LASIX) 20 MG tablet Take every other day for bilateral lower extremity edema. 30 tablet 1  . lidocaine (LIDODERM) 5 % Place 1 patch onto the skin every 12 (twelve) hours as needed (pain). Remove & Discard patch within 12 hours or as directed by MD 30 patch 2  . linezolid (ZYVOX) 600 MG tablet Take 600 mg by mouth 2 (two) times daily.    . tamsulosin (FLOMAX) 0.4 MG CAPS capsule TAKE 1 CAPSULE BY MOUTH  DAILY 90 capsule 3  . triamcinolone ointment (KENALOG) 0.1 % Apply 1 application topically 2 (two) times daily. To GLANS, FORESKIN, and SCROTUM. 60 g 6  . fluticasone (FLONASE) 50 MCG/ACT nasal spray 1 spray by Both Nostrils route daily. 16 g 2  . ipratropium (ATROVENT) 0.06 % nasal spray Place 2 sprays into both nostrils 4 (four) times daily as needed for rhinitis. 15 mL 0  . nystatin  (MYCOSTATIN/NYSTOP) powder Apply topically 3 (three) times daily. 45 g 1  . nystatin cream (MYCOSTATIN) Apply 1 application topically 2 (two) times daily. To belly button 30 g 0   No facility-administered medications prior to visit.    No Known Allergies  ROS Review of Systems    Objective:    Physical Exam  Constitutional: He is oriented to person, place, and time. He appears well-developed and well-nourished.  HENT:  Head: Normocephalic and atraumatic.  Cardiovascular: Normal rate, regular rhythm and normal heart sounds.  Pulmonary/Chest: Effort normal and breath sounds normal.  Abdominal: Soft. Bowel sounds are normal. He exhibits no distension.  Neurological: He is alert and oriented to person, place, and time.  Skin: Skin is warm and dry.  2+ pitting edema both lower extremities from the thighs down.  Psychiatric: He has a normal mood and affect. His behavior is normal.    BP (!) 142/62   Pulse 79   Temp 98.2 F (36.8 C)   Ht 5' 4.96" (1.65 m)   SpO2 98%   BMI 22.85 kg/m  Wt Readings from Last 3 Encounters:  03/16/20 137 lb 1.9 oz (62.2 kg)  03/02/20 139 lb (63 kg)  02/24/20 138 lb (62.6 kg)     There are no preventive care reminders to display for this patient.  There are no preventive care reminders to display for this patient.  Lab Results  Component Value Date   TSH 1.73 05/14/2019   Lab Results  Component Value Date   WBC 6.7 04/07/2020   HGB 9.8 (L) 04/07/2020   HCT 30.0 (L) 04/07/2020   MCV 94.9 04/07/2020   PLT 146 04/07/2020   Lab Results  Component Value Date   NA 125 (L) 04/07/2020   K 4.8 04/07/2020   CO2 24 04/07/2020   GLUCOSE 100 (H) 04/07/2020   BUN 25 04/07/2020   CREATININE 1.50 (H) 04/07/2020   BILITOT 0.4 04/07/2020   ALKPHOS 51 07/17/2019   AST 21 04/07/2020   ALT 20 04/07/2020   PROT 5.4 (L) 04/07/2020   CALCIUM 8.5 (L) 04/07/2020   Lab Results  Component Value Date   CHOL 173 11/05/2017   Lab Results  Component  Value Date   HDL 119 11/05/2017   Lab Results  Component Value Date   LDLCALC 41 11/05/2017   Lab Results  Component Value Date   TRIG 45 11/05/2017   Lab Results  Component Value Date   CHOLHDL 1.5 11/05/2017   No results found for: HGBA1C    Assessment & Plan:   Problem List Items Addressed This Visit      Other   Pulmonary nodule    Plan to get updated CT.  Overall he is low risk but nodule was seen on CT from 2019 and I would like to follow that up especially with his recent health complications.      Relevant Orders   CT CHEST NODULE FOLLOW UP LOW DOSE W/O   Nausea    Likely GERD or gastritis related again want to try taking his H2 blocker twice a day.  Could consider switching to a PPI as well.      Relevant Orders   COMPLETE METABOLIC PANEL WITH GFR (Completed)   CBC with Differential/Platelet (Completed)   Hyponatremia - Primary    Still unclear etiology.  Consider could be from the Lasix though less typical for loop diuretics to cause hyponatremia but certainly it is possible.  Also consider adrenal insufficiency.  Also consider partly because of his renal failure.  It looks like he was referred to nephrology but I am not sure if he has had his first appointment yet so we will need to verify that.  Also consider malignancy is a possibility.  In reviewing back through his old notes he did have a pulmonary nodule that was noted back in 2019 on chest CT he has not had that followed up on since then because he was overall felt  to be low risk but I think at this point because of the hyponatremia and swelling I think it would be reasonable to do a chest CT for further work-up.      Relevant Orders   COMPLETE METABOLIC PANEL WITH GFR (Completed)   CBC with Differential/Platelet (Completed)   TSH + free T4   Sodium, urine, random   Osmolality, urine   Urine Microalbumin w/creat. ratio   Hypocalcemia   Relevant Orders   COMPLETE METABOLIC PANEL WITH GFR (Completed)    CBC with Differential/Platelet (Completed)   TSH + free T4   Sodium, urine, random   Osmolality, urine   Urine Microalbumin w/creat. ratio   Hyperkalemia    Hopefully will improve now that he is on the Lasix every other day.  He also says he recently stopped eating bananas to try to improve his potassium.      Relevant Orders   COMPLETE METABOLIC PANEL WITH GFR (Completed)   CBC with Differential/Platelet (Completed)   TSH + free T4   Sodium, urine, random   Osmolality, urine   Urine Microalbumin w/creat. ratio   Epigastric pain    With history of epigastric pain and known GERD I recommended that he get on his Pepcid twice a day instead of just using it as needed I want him to do this consistently for 2 weeks just to see if he is noticing he is having less frequent discomfort which is not daily but it is occasional and to see if it actually improves his appetite and is able to eat a little bit more.      Relevant Orders   COMPLETE METABOLIC PANEL WITH GFR (Completed)   Lipase (Completed)   CBC with Differential/Platelet (Completed)   Bilateral lower extremity edema    Significant bilateral lower extremity edema x6 months.  Based on clinical exam and history most consistent with a combination of chronic venous stasis and some lymphedema.  He has not noticed any significant improvement on the being on the Lasix every other day or but we also discussed limiting his fluid intake to about 42 ounces daily and definitely no more than 50 ounces.        Other Visit Diagnoses    Weakness of both lower extremities       Relevant Orders   Ambulatory referral to Home Health   Anorexia       Abnormal findings on diagnostic imaging of lung       Relevant Orders   CT CHEST NODULE FOLLOW UP LOW DOSE W/O      No orders of the defined types were placed in this encounter.  Time spent 45 minutes in encounter.  This patient is quite complicated and I am covering for my partner had to do a pretty  extensive review of his notes.  Also had a long discussion with him and his daughter today about discussing end-of-life care and what he would prefer.  He did say quite plainly today that he would not want to be on a ventilator.  It does sound like they do have a healthcare power of attorney.  He would actually probably be a good candidate for a palliative care consult to discuss end-of-life goals.  Follow-up: Return in about 15 days (around 04/22/2020).    Beatrice Lecher, MD

## 2020-04-08 ENCOUNTER — Encounter: Payer: Self-pay | Admitting: Family Medicine

## 2020-04-08 DIAGNOSIS — R110 Nausea: Secondary | ICD-10-CM | POA: Insufficient documentation

## 2020-04-08 DIAGNOSIS — R1013 Epigastric pain: Secondary | ICD-10-CM | POA: Insufficient documentation

## 2020-04-08 DIAGNOSIS — E875 Hyperkalemia: Secondary | ICD-10-CM | POA: Insufficient documentation

## 2020-04-08 DIAGNOSIS — R911 Solitary pulmonary nodule: Secondary | ICD-10-CM | POA: Insufficient documentation

## 2020-04-08 NOTE — Assessment & Plan Note (Signed)
Still unclear etiology.  Consider could be from the Lasix though less typical for loop diuretics to cause hyponatremia but certainly it is possible.  Also consider adrenal insufficiency.  Also consider partly because of his renal failure.  It looks like he was referred to nephrology but I am not sure if he has had his first appointment yet so we will need to verify that.  Also consider malignancy is a possibility.  In reviewing back through his old notes he did have a pulmonary nodule that was noted back in 2019 on chest CT he has not had that followed up on since then because he was overall felt to be low risk but I think at this point because of the hyponatremia and swelling I think it would be reasonable to do a chest CT for further work-up.

## 2020-04-08 NOTE — Assessment & Plan Note (Signed)
Likely GERD or gastritis related again want to try taking his H2 blocker twice a day.  Could consider switching to a PPI as well.

## 2020-04-08 NOTE — Assessment & Plan Note (Signed)
Significant bilateral lower extremity edema x6 months.  Based on clinical exam and history most consistent with a combination of chronic venous stasis and some lymphedema.  He has not noticed any significant improvement on the being on the Lasix every other day or but we also discussed limiting his fluid intake to about 42 ounces daily and definitely no more than 50 ounces.

## 2020-04-08 NOTE — Assessment & Plan Note (Signed)
With history of epigastric pain and known GERD I recommended that he get on his Pepcid twice a day instead of just using it as needed I want him to do this consistently for 2 weeks just to see if he is noticing he is having less frequent discomfort which is not daily but it is occasional and to see if it actually improves his appetite and is able to eat a little bit more.

## 2020-04-08 NOTE — Assessment & Plan Note (Signed)
Hopefully will improve now that he is on the Lasix every other day.  He also says he recently stopped eating bananas to try to improve his potassium.

## 2020-04-08 NOTE — Assessment & Plan Note (Signed)
Plan to get updated CT.  Overall he is low risk but nodule was seen on CT from 2019 and I would like to follow that up especially with his recent health complications.

## 2020-04-12 ENCOUNTER — Other Ambulatory Visit: Payer: Self-pay | Admitting: *Deleted

## 2020-04-12 ENCOUNTER — Other Ambulatory Visit (INDEPENDENT_AMBULATORY_CARE_PROVIDER_SITE_OTHER): Payer: Medicare Other | Admitting: *Deleted

## 2020-04-12 DIAGNOSIS — Z1211 Encounter for screening for malignant neoplasm of colon: Secondary | ICD-10-CM

## 2020-04-12 DIAGNOSIS — D649 Anemia, unspecified: Secondary | ICD-10-CM

## 2020-04-12 LAB — POC HEMOCCULT BLD/STL (HOME/3-CARD/SCREEN)
Card #2 Fecal Occult Blod, POC: NEGATIVE
Card #3 Fecal Occult Blood, POC: NEGATIVE
Fecal Occult Blood, POC: NEGATIVE

## 2020-04-13 ENCOUNTER — Other Ambulatory Visit: Payer: Self-pay | Admitting: Family Medicine

## 2020-04-13 DIAGNOSIS — D649 Anemia, unspecified: Secondary | ICD-10-CM

## 2020-04-13 LAB — COMPLETE METABOLIC PANEL WITHOUT GFR
AG Ratio: 1.8 (calc) (ref 1.0–2.5)
ALT: 20 U/L (ref 9–46)
AST: 21 U/L (ref 10–35)
Albumin: 3.5 g/dL — ABNORMAL LOW (ref 3.6–5.1)
Alkaline phosphatase (APISO): 45 U/L (ref 35–144)
BUN/Creatinine Ratio: 17 (calc) (ref 6–22)
BUN: 25 mg/dL (ref 7–25)
CO2: 24 mmol/L (ref 20–32)
Calcium: 8.5 mg/dL — ABNORMAL LOW (ref 8.6–10.3)
Chloride: 92 mmol/L — ABNORMAL LOW (ref 98–110)
Creat: 1.5 mg/dL — ABNORMAL HIGH (ref 0.70–1.11)
GFR, Est African American: 45 mL/min/1.73m2 — ABNORMAL LOW
GFR, Est Non African American: 38 mL/min/1.73m2 — ABNORMAL LOW
Globulin: 1.9 g/dL (ref 1.9–3.7)
Glucose, Bld: 100 mg/dL — ABNORMAL HIGH (ref 65–99)
Potassium: 4.8 mmol/L (ref 3.5–5.3)
Sodium: 125 mmol/L — ABNORMAL LOW (ref 135–146)
Total Bilirubin: 0.4 mg/dL (ref 0.2–1.2)
Total Protein: 5.4 g/dL — ABNORMAL LOW (ref 6.1–8.1)

## 2020-04-13 LAB — CBC WITH DIFFERENTIAL/PLATELET
Absolute Monocytes: 570 cells/uL (ref 200–950)
Basophils Absolute: 27 cells/uL (ref 0–200)
Basophils Relative: 0.4 %
Eosinophils Absolute: 40 cells/uL (ref 15–500)
Eosinophils Relative: 0.6 %
HCT: 30 % — ABNORMAL LOW (ref 38.5–50.0)
Hemoglobin: 9.8 g/dL — ABNORMAL LOW (ref 13.2–17.1)
Lymphs Abs: 670 cells/uL — ABNORMAL LOW (ref 850–3900)
MCH: 31 pg (ref 27.0–33.0)
MCHC: 32.7 g/dL (ref 32.0–36.0)
MCV: 94.9 fL (ref 80.0–100.0)
MPV: 11 fL (ref 7.5–12.5)
Monocytes Relative: 8.5 %
Neutro Abs: 5394 cells/uL (ref 1500–7800)
Neutrophils Relative %: 80.5 %
Platelets: 146 10*3/uL (ref 140–400)
RBC: 3.16 10*6/uL — ABNORMAL LOW (ref 4.20–5.80)
RDW: 12.2 % (ref 11.0–15.0)
Total Lymphocyte: 10 %
WBC: 6.7 10*3/uL (ref 3.8–10.8)

## 2020-04-13 LAB — TSH+FREE T4
Free T4: 1.5 ng/dL (ref 0.8–1.8)
TSH: 1.86 mIU/L (ref 0.40–4.50)

## 2020-04-13 LAB — MICROALBUMIN / CREATININE URINE RATIO
Creatinine, Urine: 57 mg/dL (ref 20–320)
Microalb Creat Ratio: 100 mcg/mg creat — ABNORMAL HIGH (ref <30)
Microalb, Ur: 5.7 mg/dL

## 2020-04-13 LAB — B12 AND FOLATE PANEL
Folate: 3.1 ng/mL — ABNORMAL LOW
Vitamin B-12: 637 pg/mL (ref 200–1100)

## 2020-04-13 LAB — OSMOLALITY, URINE: Osmolality, Ur: 315 mOsm/kg (ref 50–1200)

## 2020-04-13 LAB — LIPASE: Lipase: 14 U/L (ref 7–60)

## 2020-04-13 LAB — SODIUM, URINE, RANDOM: Sodium, Ur: 36 mmol/L (ref 28–272)

## 2020-04-13 MED ORDER — NYSTATIN 100000 UNIT/GM EX OINT: 1.0000 "application " | TOPICAL_OINTMENT | Freq: Two times a day (BID) | CUTANEOUS | 0 refills | Status: DC

## 2020-04-13 NOTE — Addendum Note (Signed)
Addended by: Beatrice Lecher D on: 04/13/2020 03:05 PM   Modules accepted: Orders

## 2020-04-14 ENCOUNTER — Ambulatory Visit (INDEPENDENT_AMBULATORY_CARE_PROVIDER_SITE_OTHER): Payer: Medicare Other

## 2020-04-14 ENCOUNTER — Other Ambulatory Visit: Payer: Self-pay

## 2020-04-14 DIAGNOSIS — R918 Other nonspecific abnormal finding of lung field: Secondary | ICD-10-CM | POA: Diagnosis not present

## 2020-04-14 DIAGNOSIS — R911 Solitary pulmonary nodule: Secondary | ICD-10-CM

## 2020-04-15 ENCOUNTER — Encounter: Payer: Self-pay | Admitting: Family Medicine

## 2020-04-15 DIAGNOSIS — N261 Atrophy of kidney (terminal): Secondary | ICD-10-CM | POA: Insufficient documentation

## 2020-04-18 ENCOUNTER — Other Ambulatory Visit: Payer: Self-pay | Admitting: Family

## 2020-04-18 DIAGNOSIS — E538 Deficiency of other specified B group vitamins: Secondary | ICD-10-CM

## 2020-04-18 DIAGNOSIS — D649 Anemia, unspecified: Secondary | ICD-10-CM

## 2020-04-19 ENCOUNTER — Encounter: Payer: Self-pay | Admitting: Family

## 2020-04-19 ENCOUNTER — Inpatient Hospital Stay: Payer: Medicare Other

## 2020-04-19 ENCOUNTER — Other Ambulatory Visit: Payer: Self-pay

## 2020-04-19 ENCOUNTER — Inpatient Hospital Stay: Payer: Medicare Other | Attending: Family | Admitting: Family

## 2020-04-19 DIAGNOSIS — M19042 Primary osteoarthritis, left hand: Secondary | ICD-10-CM

## 2020-04-19 DIAGNOSIS — R7402 Elevation of levels of lactic acid dehydrogenase (LDH): Secondary | ICD-10-CM | POA: Insufficient documentation

## 2020-04-19 DIAGNOSIS — Z8042 Family history of malignant neoplasm of prostate: Secondary | ICD-10-CM | POA: Insufficient documentation

## 2020-04-19 DIAGNOSIS — N1831 Chronic kidney disease, stage 3a: Secondary | ICD-10-CM

## 2020-04-19 DIAGNOSIS — M19041 Primary osteoarthritis, right hand: Secondary | ICD-10-CM | POA: Insufficient documentation

## 2020-04-19 DIAGNOSIS — Z8546 Personal history of malignant neoplasm of prostate: Secondary | ICD-10-CM | POA: Insufficient documentation

## 2020-04-19 DIAGNOSIS — L97529 Non-pressure chronic ulcer of other part of left foot with unspecified severity: Secondary | ICD-10-CM | POA: Diagnosis not present

## 2020-04-19 DIAGNOSIS — I739 Peripheral vascular disease, unspecified: Secondary | ICD-10-CM | POA: Diagnosis not present

## 2020-04-19 DIAGNOSIS — E538 Deficiency of other specified B group vitamins: Secondary | ICD-10-CM

## 2020-04-19 DIAGNOSIS — Z923 Personal history of irradiation: Secondary | ICD-10-CM | POA: Insufficient documentation

## 2020-04-19 DIAGNOSIS — D649 Anemia, unspecified: Secondary | ICD-10-CM

## 2020-04-19 DIAGNOSIS — Z8673 Personal history of transient ischemic attack (TIA), and cerebral infarction without residual deficits: Secondary | ICD-10-CM

## 2020-04-19 DIAGNOSIS — Z87891 Personal history of nicotine dependence: Secondary | ICD-10-CM | POA: Insufficient documentation

## 2020-04-19 DIAGNOSIS — Z7901 Long term (current) use of anticoagulants: Secondary | ICD-10-CM | POA: Insufficient documentation

## 2020-04-19 DIAGNOSIS — D509 Iron deficiency anemia, unspecified: Secondary | ICD-10-CM | POA: Diagnosis present

## 2020-04-19 DIAGNOSIS — D631 Anemia in chronic kidney disease: Secondary | ICD-10-CM

## 2020-04-19 DIAGNOSIS — C61 Malignant neoplasm of prostate: Secondary | ICD-10-CM | POA: Diagnosis not present

## 2020-04-19 LAB — FOLATE: Folate: 13.5 ng/mL (ref >5.9)

## 2020-04-19 LAB — CBC WITH DIFFERENTIAL (CANCER CENTER ONLY)
Abs Immature Granulocytes: 0.13 10*3/uL — ABNORMAL HIGH (ref 0.00–0.07)
Basophils Absolute: 0 10*3/uL (ref 0.0–0.1)
Basophils Relative: 0 %
Eosinophils Absolute: 0.1 10*3/uL (ref 0.0–0.5)
Eosinophils Relative: 1 %
HCT: 30.5 % — ABNORMAL LOW (ref 39.0–52.0)
Hemoglobin: 10 g/dL — ABNORMAL LOW (ref 13.0–17.0)
Immature Granulocytes: 2 %
Lymphocytes Relative: 9 %
Lymphs Abs: 0.8 10*3/uL (ref 0.7–4.0)
MCH: 31 pg (ref 26.0–34.0)
MCHC: 32.8 g/dL (ref 30.0–36.0)
MCV: 94.4 fL (ref 80.0–100.0)
Monocytes Absolute: 0.8 10*3/uL (ref 0.1–1.0)
Monocytes Relative: 10 %
Neutro Abs: 6.6 10*3/uL (ref 1.7–7.7)
Neutrophils Relative %: 78 %
Platelet Count: 291 10*3/uL (ref 150–400)
RBC: 3.23 MIL/uL — ABNORMAL LOW (ref 4.22–5.81)
RDW: 13.5 % (ref 11.5–15.5)
WBC Count: 8.4 10*3/uL (ref 4.0–10.5)
nRBC: 0 % (ref 0.0–0.2)

## 2020-04-19 LAB — IRON AND TIBC
Iron: 56 ug/dL (ref 42–163)
Saturation Ratios: 26 % (ref 20–55)
TIBC: 218 ug/dL (ref 202–409)
UIBC: 161 ug/dL (ref 117–376)

## 2020-04-19 LAB — LACTATE DEHYDROGENASE: LDH: 228 U/L — ABNORMAL HIGH (ref 98–192)

## 2020-04-19 LAB — CMP (CANCER CENTER ONLY)
ALT: 16 U/L (ref 0–44)
AST: 19 U/L (ref 15–41)
Albumin: 3.7 g/dL (ref 3.5–5.0)
Alkaline Phosphatase: 52 U/L (ref 38–126)
Anion gap: 6 (ref 5–15)
BUN: 28 mg/dL — ABNORMAL HIGH (ref 8–23)
CO2: 29 mmol/L (ref 22–32)
Calcium: 9.1 mg/dL (ref 8.9–10.3)
Chloride: 95 mmol/L — ABNORMAL LOW (ref 98–111)
Creatinine: 1.26 mg/dL — ABNORMAL HIGH (ref 0.61–1.24)
GFR, Est AFR Am: 55 mL/min — ABNORMAL LOW (ref >60)
GFR, Estimated: 47 mL/min — ABNORMAL LOW (ref >60)
Glucose, Bld: 91 mg/dL (ref 70–99)
Potassium: 4.5 mmol/L (ref 3.5–5.1)
Sodium: 130 mmol/L — ABNORMAL LOW (ref 135–145)
Total Bilirubin: 0.3 mg/dL (ref 0.3–1.2)
Total Protein: 6.3 g/dL — ABNORMAL LOW (ref 6.5–8.1)

## 2020-04-19 LAB — RETICULOCYTES
Immature Retic Fract: 14.5 % (ref 2.3–15.9)
RBC.: 3.19 MIL/uL — ABNORMAL LOW (ref 4.22–5.81)
Retic Count, Absolute: 46.9 10*3/uL (ref 19.0–186.0)
Retic Ct Pct: 1.5 % (ref 0.4–3.1)

## 2020-04-19 LAB — FERRITIN: Ferritin: 311 ng/mL (ref 24–336)

## 2020-04-19 LAB — VITAMIN B12: Vitamin B-12: 441 pg/mL (ref 180–914)

## 2020-04-19 LAB — SAVE SMEAR(SSMR), FOR PROVIDER SLIDE REVIEW

## 2020-04-19 NOTE — Progress Notes (Signed)
Hematology/Oncology Consultation   Name: Mark Mora      MRN: [id number]    Location: Room/bed info not found  Date: 04/19/2020 Time:10:07 AM   REFERRING PHYSICIAN: Beatrice Lecher, MD  REASON FOR CONSULT: Low hemoglobin    DIAGNOSIS: Iron deficiency anemia   Of Note: Patient is a Jehovah's Witness, NO BLOOD PRODUCTS  HISTORY OF PRESENT ILLNESS: Mark Mora is a very pleasant 86 yo African American gentleman with history of anemia over the last few years that has recently worsened.  Hgb today is stable at 10, MCV 94. Platelet count is back to normal at 291.  He has been taking an oral iron supplement once a day.  He is on Eliquis for history of TIAs and has not noted any obvious blood loss. No petechiae.  Hemoccult testing last week was negative.  He has history of prostate cancer diagnosed in 1992 and states that he received radiation therapy for about a year. Prostate is still in place.  PSA last year was 37.1.  His brother also had prostate cancer.  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 We thanked him for his service. It is truly a privilege to be a part of his care team.  No sickle cell disease or trait.  No diabetes or thyroid disease.  He recently had labs with nephrology that showed mildly elevated kappa light chains but no M-spike.  No fever, chills, n/v, cough, rash, dizziness, SOB, chest pain, palpitations or changes in bladder habits.  He has noted some constipation while taking the iron supplement.  He has chronic swelling and tenderness in both lower extremities secondary to PVD. He wears compression stockings daily and support shoes.  In January, he had a left common femoral arterial endarterectomy with vein punch angioplasty and profundaplasty.  He has an ulcer of the left great toe that is being managed by wound care. He uses betadine daily and keeps wrapped. He has had some issues with cellulitis in the left lower  extremity and has been on and off of antibiotic therapy.  He is in a wheelchair today but states that he does ambulate some at home with a walker.  He does have some tenderness and numbness in his hands secondary to arthritis.  He feels that his appetite has been a little better. He does drink 1-2 Ensure daily. He is doing his best to stay well hydrated.  He briefly smoked in his late teens before quitting. No history of ETOH or recreational drug use. He is a retired Tour manager.    ROS: All other 10 point review of systems is negative.   PAST MEDICAL HISTORY:   Past Medical History:  Diagnosis Date  . Carotid atherosclerosis, right 11/05/2017  . CKD (chronic kidney disease) stage 3, GFR 30-59 ml/min 11/05/2017  . Essential hypertension 11/05/2017  . Hearing loss due to cerumen impaction, bilateral 11/05/2017  . Hyperlipidemia   . Hypertension   . Pure hypercholesterolemia 11/05/2017  . Screening for diabetes mellitus 11/05/2017  . Stroke ([place])   . Varicose veins of right lower extremity with edema 11/05/2017    ALLERGIES: No Known Allergies    MEDICATIONS:  Current Outpatient Medications on File Prior to Visit  Medication Sig Dispense Refill  . acetaminophen (TYLENOL) 650 MG CR tablet Take by mouth.    . AMBULATORY NON FORMULARY MEDICATION Compression stockings for chronic venous stasis 20-51mHg. 2 Device 0  . apixaban (ELIQUIS) 2.5  MG TABS tablet Take 1 tablet (2.5 mg total) by mouth 2 (two) times daily. 60 tablet 5  . atorvastatin (LIPITOR) 40 MG tablet TAKE 1 TABLET BY MOUTH  DAILY. NEEDS LAB WORK 90 tablet 3  . Cholecalciferol (VITAMIN D3) 2000 units TABS Take by mouth.    . diclofenac Sodium (VOLTAREN) 1 % GEL Apply 4 g topically 4 (four) times daily. To affected joint. 350 g 11  . famotidine (PEPCID) 20 MG tablet Take 1 tablet (20 mg total) by mouth 2 (two) times daily. 15 tablet 0  . furosemide (LASIX) 20 MG tablet Take every other day for bilateral lower extremity  edema. 30 tablet 1  . lidocaine (LIDODERM) 5 % Place 1 patch onto the skin every 12 (twelve) hours as needed (pain). Remove & Discard patch within 12 hours or as directed by MD 30 patch 2  . linezolid (ZYVOX) 600 MG tablet Take 600 mg by mouth 2 (two) times daily.    Marland Kitchen nystatin ointment (MYCOSTATIN) Apply 1 application topically 2 (two) times daily. 30 g 0  . tamsulosin (FLOMAX) 0.4 MG CAPS capsule TAKE 1 CAPSULE BY MOUTH  DAILY 90 capsule 3  . triamcinolone ointment (KENALOG) 0.1 % Apply 1 application topically 2 (two) times daily. To GLANS, FORESKIN, and SCROTUM. 60 g 6   No current facility-administered medications on file prior to visit.     PAST SURGICAL HISTORY Past Surgical History:  Procedure Laterality Date  . HERNIA REPAIR      FAMILY HISTORY: No family history on file.  SOCIAL HISTORY:  reports that he has quit smoking. His smoking use included cigarettes. He has a 1.50 pack-year smoking history. He has never used smokeless tobacco. He reports that he does not drink alcohol or use drugs.  PERFORMANCE STATUS: The patient's performance status is 2 - Symptomatic, <50% confined to bed  PHYSICAL EXAM: Most Recent Vital Signs: There were no vitals taken for this visit. There were no vitals taken for this visit.  General Appearance:    Alert, cooperative, no distress, appears stated age  Head:    Normocephalic, without obvious abnormality, atraumatic  Eyes:    PERRL, conjunctiva/corneas clear, EOM's intact, fundi    benign, both eyes             Throat:   Lips, mucosa, and tongue normal; teeth and gums normal  Neck:   Supple, symmetrical, trachea midline, no adenopathy;       thyroid:  No enlargement/tenderness/nodules; no carotid   bruit or JVD  Back:     Symmetric, no curvature, ROM normal, no CVA tenderness  Lungs:     Clear to auscultation bilaterally, respirations unlabored  Chest wall:    No tenderness or deformity  Heart:    Regular rate and rhythm, S1 and S2  normal, no murmur, rub   or gallop  Abdomen:     Soft, non-tender, bowel sounds active all four quadrants,    no masses, no organomegaly        Extremities:   Extremities normal, atraumatic, no cyanosis or edema  Pulses:   2+ and symmetric all extremities  Skin:   Skin color, texture, turgor normal, no rashes or lesions  Lymph nodes:   Cervical, supraclavicular, and axillary nodes normal  Neurologic:   CNII-XII intact. Normal strength, sensation and reflexes      throughout    LABORATORY DATA:  No results found for this or any previous visit (from the past 48 hour(s)).  RADIOGRAPHY: No results found.     PATHOLOGY: None  ASSESSMENT/PLAN: Mr. Thum is a very pleasant 86 yo African American gentleman with history of anemia over the last few years that has recently worsened. He has history of prostate cancer with radiation therapy as well as off and on issues with cellulitis of the left lower extremity and antibiotic therapy. We will see what his lab work looks like and if any intervention is needed for his anemia at this time.  We will schedule his follow-up once we have gone over his lab work.   All questions were answered and they are in agreement with the plan (his daughter is also with him today). They will contact our office with any questions or concerns. We can certainly see him sooner if needed.   He was discussed with and also seen by Dr. Marin Olp and he is in agreement with the aforementioned.   Laverna Peace, NP   Addendum: I saw and examined Mr. Hangartner with Judson Roch.  He is an incredibly nice man.  It is hard to believe that he is 86 years old.  I have to believe that the issue with the anemia is going to wind up being his radiation that he had for his prostate cancer.  He had bone marrow coverage with radiation.  There may be some bone marrow damage from past radiation.  His corrected reticulocyte count is about 1%.  As such, he is not hemolyzing.  I looked at  his blood smear under the microscope.  I really do not see any changes that would suggest iron deficiency.  I saw nothing that looks like a hematologic malignancy.  I suppose that at 86 years old, myelodysplasia is always a possibility.  I just do not see the need for a bone marrow biopsy with Mr. Casasola.  His iron studies showed a ferritin of 311 with an iron saturation of 26%..  The LDH is minimally elevated at 228.  He does have a relatively low erythropoietin level of 30.  As such, he would respond to all ESA if necessary.  I really do not think is that symptomatic right now.  I think we can just follow him along.  If we see that his blood count is dropping, then we can intervene.  We spent about 45 minutes with Mr. Sessler.  We went over his lab work.  We went over our recommendations.  We answered his questions.  Lattie Haw, MD

## 2020-04-20 ENCOUNTER — Telehealth: Payer: Self-pay | Admitting: Family

## 2020-04-20 LAB — ERYTHROPOIETIN: Erythropoietin: 30.3 m[IU]/mL — ABNORMAL HIGH (ref 2.6–18.5)

## 2020-04-20 NOTE — Telephone Encounter (Signed)
No los 5/11

## 2020-04-21 LAB — HGB FRACTIONATION CASCADE
Hgb A2: 3 % (ref 1.8–3.2)
Hgb A: 97 % (ref 96.4–98.8)
Hgb F: 0 % (ref 0.0–2.0)
Hgb S: 0 %

## 2020-04-25 ENCOUNTER — Telehealth: Payer: Self-pay

## 2020-04-25 NOTE — Telephone Encounter (Addendum)
Mark Mora is wanting Luvenia Starch to send him to a different urologist. He states the last urologist didn't listen to him. Please advise.

## 2020-04-26 NOTE — Telephone Encounter (Signed)
He might could see someone different in the group? But ok with new referral.

## 2020-04-26 NOTE — Telephone Encounter (Signed)
Gave referral to [facility]. She will send to a different Urology.

## 2020-04-28 LAB — ALPHA-THALASSEMIA GENOTYPR

## 2020-04-29 ENCOUNTER — Telehealth: Payer: Self-pay | Admitting: Family

## 2020-04-29 NOTE — Telephone Encounter (Signed)
I was able to speak with Mark Mora and his daughter Mark Mora to go over his recent lab work. Everything cam back stable and no intervention is needed at this this time.  We will plan to see him again in 3 months for follow-up.  They verbalized understanding and agreement with the plan. No other questions at this time.

## 2020-04-29 NOTE — Telephone Encounter (Signed)
Appointments scheduled calendar printed per 5/21 sch msg

## 2020-05-03 ENCOUNTER — Ambulatory Visit (INDEPENDENT_AMBULATORY_CARE_PROVIDER_SITE_OTHER): Payer: Medicare Other | Admitting: Physician Assistant

## 2020-05-03 VITALS — BP 145/68 | HR 77

## 2020-05-03 DIAGNOSIS — N1832 Chronic kidney disease, stage 3b: Secondary | ICD-10-CM

## 2020-05-03 DIAGNOSIS — I739 Peripheral vascular disease, unspecified: Secondary | ICD-10-CM | POA: Diagnosis not present

## 2020-05-03 DIAGNOSIS — I10 Essential (primary) hypertension: Secondary | ICD-10-CM

## 2020-05-03 DIAGNOSIS — L97521 Non-pressure chronic ulcer of other part of left foot limited to breakdown of skin: Secondary | ICD-10-CM

## 2020-05-03 DIAGNOSIS — E871 Hypo-osmolality and hyponatremia: Secondary | ICD-10-CM

## 2020-05-03 DIAGNOSIS — R600 Localized edema: Secondary | ICD-10-CM

## 2020-05-03 MED ORDER — TRAMADOL HCL 50 MG PO TABS: 50.0000 mg | ORAL_TABLET | Freq: Two times a day (BID) | ORAL | 0 refills | Status: AC | PRN

## 2020-05-03 NOTE — Progress Notes (Signed)
Subjective:    Patient ID: Mark Mora, male    DOB: 1920-07-11, 86 y.o.   MRN: [id number]  HPI  Pt is a 86 yo male who presents to the clinic for follow up for chronic lower extremity swelling for more than 6 months, abnormal labs, skin ulcer of left foot.   Pt is seeing vascular and restored some increased blood flow to the left leg but there is really nothing they can do.   Pt is seeing nephrology and increased lasix to 60mg  but he could not tolerate. He is now taking 40mg  daily. He has restricted his fluids to no more than 50oz a day.   Pt is seeing podiatry and wound care monthly to re-evalute sores. He wears bilateral post-op show to help relieve pressure. He is having more pain with ulcers. He uses tylenol during the day but at night at times he cannot sleep.   Pts nausea has improved and uses pepcid as needed.   2 weeks ago sodium was back up. He did have low hemoglobin and saw hematology. Hematology states labs were stable and needed no further intervention.   .. Active Ambulatory Problems    Diagnosis Date Noted  . CKD (chronic kidney disease) stage 3, GFR 30-59 ml/min 11/05/2017  . 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 Essential hypertension 11/05/2017  . Carotid atherosclerosis, right 11/05/2017  . Pure hypercholesterolemia 11/05/2017  . Screening for diabetes mellitus 11/05/2017  . Hearing loss due to cerumen impaction, bilateral 11/05/2017  . Toenail fungus 03/20/2018  . Hearing loss of left ear due to cerumen impaction 03/20/2018  . Gastroesophageal reflux disease with esophagitis 03/23/2018  . Benign prostatic hyperplasia with urinary frequency 03/23/2018  . Neuropathy 03/23/2018  . History of prostate cancer 05/05/2018  . Numbness and tingling of both feet 05/05/2018  . Prostate cancer ([place]) 05/23/2018  . Weight loss 05/23/2018  . Spinal stenosis of lumbar region 05/23/2018  . Pulmonary embolus ([place]) 07/15/2018  . Cerebrovascular  accident (CVA) due to thrombosis of precerebral artery ([place]) 07/15/2018  . Paresthesias 07/16/2018  . Hypotension 07/16/2018  . Slow transit constipation 07/26/2018  . Bilateral upper abdominal discomfort 07/26/2018  . Burping 07/26/2018  . Opacity of lung on imaging study 07/26/2018  . Centrilobular emphysema ([place]) 08/15/2018  . Hyponatremia 08/15/2018  . Dystrophic nail 09/10/2018  . Bilateral lower extremity edema 09/10/2018  . Chronic venous stasis dermatitis of both lower extremities 09/10/2018  . Degenerative joint disease of cervical spine 09/26/2018  . Elevated troponin 07/04/2018  . GERD (gastroesophageal reflux disease) 09/26/2018  . Hyperlipemia 09/26/2018  . Hyperglycemia 07/04/2018  . Bilateral impacted cerumen 11/23/2018  . Chronic venous stasis 01/19/2019  . Chronic right shoulder pain 01/19/2019  . Ganglion cyst of volar aspect of left wrist 02/17/2019  . Fatigue 05/15/2019  . Sleeping excessive 05/15/2019  . Paraphimosis 11/30/2019  . PAD (peripheral artery disease) ([place]) 01/29/2020  . Vertigo 01/29/2020  . Pressure injury of contiguous region involving back, buttock, and hip, stage 1 02/15/2020  . Dysuria 03/01/2020  . Pressure injury of deep tissue of sacral region 03/01/2020  . Excessive sleepiness 03/18/2020  . Ischemic ulcer of toe of left foot, limited to breakdown of skin ([place]) 04/04/2020  . Hyperkalemia 04/08/2020  . Hypocalcemia 04/08/2020  . Nausea 04/08/2020  . Epigastric pain 04/08/2020  . Pulmonary nodule 04/08/2020  . Renal atrophy, left 04/15/2020   Resolved Ambulatory Problems    Diagnosis Date Noted  . Ingrown toenail  of left foot 03/20/2018  . BPH (benign prostatic hyperplasia) 09/26/2018  . Drooling 03/18/2020   Past Medical History:  Diagnosis Date  . Hyperlipidemia   . Hypertension   . Stroke [facility])     Review of Systems See HPI>     Objective:   Physical Exam Vitals reviewed.  Constitutional:      Appearance: Normal  appearance.  HENT:     Head: Normocephalic.  Cardiovascular:     Rate and Rhythm: Normal rate and regular rhythm.     Pulses: Normal pulses.     Heart sounds: Murmur present.  Pulmonary:     Effort: Pulmonary effort is normal.     Breath sounds: Normal breath sounds.  Musculoskeletal:     Right lower leg: Edema present.     Left lower leg: Edema present.     Comments: I bilateral post op shoe.  Slightly macerated area but  pressure ulcer at gluteal crease has resolved.   Neurological:     General: No focal deficit present.     Mental Status: He is alert and oriented to person, place, and time.  Psychiatric:        Mood and Affect: Mood normal.           Assessment & Plan:  .Marland KitchenLyon was seen today for follow-up.  Diagnoses and all orders for this visit:  PAD (peripheral artery disease) ([place])  Essential hypertension  Stage 3b chronic kidney disease  Hyponatremia  Ischemic ulcer of toe of left foot, limited to breakdown of skin ([facility]) -     traMADol (ULTRAM) 50 MG tablet; Take 1 tablet (50 mg total) by mouth every 12 (twelve) hours as needed for up to 5 days.  Bilateral lower extremity edema   Pt is being managed by nephrology, vascular, podiatry. Follow up in 2 weeks with labs with speciality.   BP controlled today.   He is having more pain with ulcer and preventing him from sleeping. Discussed tylenol during the day but for more severe pain and if pain preventing from sleeping could use tramadol. Discussed controlled substance and to use sparingly.  Marland Kitchen.PDMP reviewed during this encounter.  Will consider pain contract if using regularly at next appointment once we see how often he takes tramadol for pain.    Follow up in 3 months.

## 2020-05-04 ENCOUNTER — Encounter: Payer: Self-pay | Admitting: Physician Assistant

## 2020-05-05 ENCOUNTER — Telehealth: Payer: Self-pay

## 2020-05-05 DIAGNOSIS — R269 Unspecified abnormalities of gait and mobility: Secondary | ICD-10-CM

## 2020-05-05 DIAGNOSIS — D539 Nutritional anemia, unspecified: Secondary | ICD-10-CM

## 2020-05-05 NOTE — Telephone Encounter (Signed)
June, physical therapist with [facility], wanted to let provider know that there is a delay in patient care due to patient's daughter wanting this to be started on a Monday.   FYI to PCP

## 2020-05-05 NOTE — Telephone Encounter (Signed)
Billing trailer B12 and Folate.

## 2020-05-06 DIAGNOSIS — D539 Nutritional anemia, unspecified: Secondary | ICD-10-CM | POA: Insufficient documentation

## 2020-05-06 DIAGNOSIS — R269 Unspecified abnormalities of gait and mobility: Secondary | ICD-10-CM | POA: Insufficient documentation

## 2020-05-06 NOTE — Telephone Encounter (Signed)
Added

## 2020-05-06 NOTE — Telephone Encounter (Signed)
Nutritional anemia and gait abnormality

## 2020-05-06 NOTE — Telephone Encounter (Signed)
Ok thanks 

## 2020-05-10 ENCOUNTER — Telehealth: Payer: Self-pay | Admitting: Physician Assistant

## 2020-05-10 NOTE — Telephone Encounter (Signed)
Ok for orders? 

## 2020-05-10 NOTE — Telephone Encounter (Signed)
June w/[facility] has called to get verbal orders for Home Health which include strengthening exercise, walking, gait, balancing and pain management.  Please call June at [phone] with verbal order if you agree.

## 2020-05-10 NOTE — Telephone Encounter (Signed)
June given verbal orders.

## 2020-06-10 ENCOUNTER — Encounter: Payer: Self-pay | Admitting: Family

## 2020-06-10 DIAGNOSIS — N189 Chronic kidney disease, unspecified: Secondary | ICD-10-CM

## 2020-06-10 DIAGNOSIS — D631 Anemia in chronic kidney disease: Secondary | ICD-10-CM | POA: Insufficient documentation

## 2020-06-10 HISTORY — DX: Anemia in chronic kidney disease: D63.1

## 2020-06-10 HISTORY — DX: Chronic kidney disease, unspecified: N18.9

## 2020-06-18 DIAGNOSIS — I350 Nonrheumatic aortic (valve) stenosis: Secondary | ICD-10-CM | POA: Insufficient documentation

## 2020-06-24 ENCOUNTER — Telehealth: Payer: Self-pay

## 2020-06-24 NOTE — Telephone Encounter (Signed)
Patient called into office saying her dad wa recently discharged from the hospital where he stayed 8 days. Patient says the hospital prescribed tramadol, he is experiencing hallucinations seeing faces when he is going to sleep. Daughter wants to know what she should do next. Patient last took medication yesterday afternoon after leaving hospital. Since then he ha been taking tylenol for pain.

## 2020-06-24 NOTE — Telephone Encounter (Signed)
Did the hallucinations stop when he stopped tramadol. If so then stop tramadol and add to allergy list and then replace with tylenol. Is tylenol helping with pain?

## 2020-06-27 MED ORDER — HYDROCODONE-ACETAMINOPHEN 5-325 MG PO TABS: 1.0000 | ORAL_TABLET | Freq: Three times a day (TID) | ORAL | 0 refills | Status: DC | PRN

## 2020-06-27 NOTE — Telephone Encounter (Signed)
Have the hallucinations resolved. Has he been ok with norco/vicodin in the past. I can give him this.

## 2020-06-27 NOTE — Addendum Note (Signed)
Addended byDonella Stade on: 06/27/2020 02:50 PM   Modules accepted: Orders

## 2020-06-27 NOTE — Telephone Encounter (Signed)
Last dose of tramadol was taken in the hospital on Thursday morning, Last episode of hallucinations happened yesterday. Patient's daughter says he is having trouble sleeping at night as well due to the pain. Patient says he cannot recall if the hallucinations started before taking tramadol. Patient has a follow up with the surgeon in 07/20/20.

## 2020-06-27 NOTE — Telephone Encounter (Signed)
Spoke to the patient's daughter. She said he hasn't had taken the tramadol since the reports of hallucination. Pain level is out of control. Please advise

## 2020-06-27 NOTE — Telephone Encounter (Signed)
Patient contacted, and says the pain is in the whole left foot.

## 2020-06-27 NOTE — Telephone Encounter (Signed)
I sent some norco to try every 8 hours as needed for pain to see if he tolerates that better and helps with pain. Is the pain his toe or all over?

## 2020-06-30 ENCOUNTER — Other Ambulatory Visit: Payer: Self-pay | Admitting: Neurology

## 2020-06-30 DIAGNOSIS — I2699 Other pulmonary embolism without acute cor pulmonale: Secondary | ICD-10-CM

## 2020-06-30 MED ORDER — APIXABAN 2.5 MG PO TABS: 2.5000 mg | ORAL_TABLET | Freq: Two times a day (BID) | ORAL | 0 refills | Status: AC

## 2020-07-01 ENCOUNTER — Telehealth: Payer: Self-pay | Admitting: Neurology

## 2020-07-01 NOTE — Telephone Encounter (Signed)
[facility], physical therapy, called to get okay to see patient for gait and strength training 2x week for 3 weeks and 1x week for 1 week. Called back [phone] and gave okay.

## 2020-07-04 DIAGNOSIS — L97522 Non-pressure chronic ulcer of other part of left foot with fat layer exposed: Secondary | ICD-10-CM | POA: Insufficient documentation

## 2020-07-05 ENCOUNTER — Encounter: Payer: Self-pay | Admitting: Family Medicine

## 2020-07-05 ENCOUNTER — Other Ambulatory Visit: Payer: Self-pay

## 2020-07-05 ENCOUNTER — Ambulatory Visit (INDEPENDENT_AMBULATORY_CARE_PROVIDER_SITE_OTHER): Payer: Medicare Other | Admitting: Family Medicine

## 2020-07-05 DIAGNOSIS — J310 Chronic rhinitis: Secondary | ICD-10-CM | POA: Diagnosis not present

## 2020-07-05 MED ORDER — IPRATROPIUM BROMIDE 0.03 % NA SOLN: 2.0000 | Freq: Two times a day (BID) | NASAL | 12 refills | Status: AC

## 2020-07-05 NOTE — Progress Notes (Signed)
Mark Mora - 86 y.o. male MRN [id number]  Date of birth: Apr 09, 1920  Subjective Chief Complaint  Patient presents with  . Nasal Congestion  . Wheezing    HPI Mark Mora is a 86 y.o. male here today with complaint of chronic nasal congestion.  He has had problems with nasal congestion and post nasal drainage for a few months.  Worsened with eating.  He has to clear his throat frequently and feels like he may be wheezing his upper chest and throat.  He denies shortness of breath, chest pain, fever, chills or increased fatigue.  He has tried flonase but only using intermittently.   ROS:  A comprehensive ROS was completed and negative except as noted per HPI  Allergies  Allergen Reactions  . Tramadol Other (See Comments)    hallucinations    Past Medical History:  Diagnosis Date  . Anemia in chronic kidney disease 06/10/2020  . Carotid atherosclerosis, right 11/05/2017  . CKD (chronic kidney disease) stage 3, GFR 30-59 ml/min 11/05/2017  . 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 Essential hypertension 11/05/2017  . Hearing loss due to cerumen impaction, bilateral 11/05/2017  . Hyperlipidemia   . Hypertension   . Pure hypercholesterolemia 11/05/2017  . Screening for diabetes mellitus 11/05/2017  . Stroke ([place])   . 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 HERNIA REPAIR      Social History   Socioeconomic History  . Marital status: Widowed    Spouse name: Not on file  . Number of children: 2  . Years of education: 24  . Highest education level: 11th grade  Occupational History  . Occupation: retired    Comment: postman  Tobacco Use  . Smoking status: Former Smoker    Packs/day: 0.25    Years: 6.00    Pack years: 1.50    Types: Cigarettes  . Smokeless tobacco: Never Used  . Tobacco comment: quit age 22  Vaping Use  . Vaping Use: Never used  Substance and Sexual Activity  .  Alcohol use: No  . Drug use: No  . Sexual activity: Never  Other Topics Concern  . Not on file  Social History Narrative   Reads daily on his IPAD. Coffee daily. Stays in house mostly due to the COVID virus this year.   Social Determinants of Health   Financial Resource Strain:   . Difficulty of Paying Living Expenses:   Food Insecurity:   . Worried About Charity fundraiser in the Last Year:   . Arboriculturist in the Last Year:   Transportation Needs:   . Film/video editor (Medical):   Marland Kitchen Lack of Transportation (Non-Medical):   Physical Activity:   . Days of Exercise per Week:   . Minutes of Exercise per Session:   Stress:   . Feeling of Stress :   Social Connections:   . Frequency of Communication with Friends and Family:   . Frequency of Social Gatherings with Friends and Family:   . Attends Religious Services:   . Active Member of Clubs or Organizations:   . Attends Archivist Meetings:   Marland Kitchen Marital Status:     History reviewed. No pertinent family history.  Health Maintenance  Topic Date Due  . TETANUS/TDAP  02/09/2021 (Originally 07/02/1941)  . PNA vac Low Risk Adult (2 of 2 - PPSV23) 02/09/2021 (Originally 12/11/1999)  . COVID-19 Vaccine (1) 04/07/2021 (  Originally 07/02/1934)  . INFLUENZA VACCINE  07/10/2020  . URINE MICROALBUMIN  04/08/2021     ----------------------------------------------------------------------------------------------------------------------------------------------------------------------------------------------------------------- Physical Exam BP (!) 174/82 (BP Location: Left Arm, Patient Position: Sitting, Cuff Size: Normal)   Pulse 64   Ht 5' 4.96" (1.65 m)   Wt 142 lb (64.4 kg)   SpO2 97%   BMI 23.66 kg/m   Physical Exam Constitutional:      Appearance: Normal appearance.  HENT:     Nose: Nose normal. No congestion.  Eyes:     General: No scleral icterus. Cardiovascular:     Rate and Rhythm: Normal rate and  regular rhythm.  Pulmonary:     Effort: Pulmonary effort is normal.     Breath sounds: Normal breath sounds.  Musculoskeletal:     Cervical back: Neck supple.  Neurological:     General: No focal deficit present.     Mental Status: He is alert.  Psychiatric:        Mood and Affect: Mood normal.        Behavior: Behavior normal.     ------------------------------------------------------------------------------------------------------------------------------------------------------------------------------------------------------------------- Assessment and Plan  Chronic rhinitis We discussed options to help with management of this. No wheezing noted on exam.  Seems to have some upper airway congestion from PND.  Will start ipratropium nasal spray BID as needed.  Follow up as needed if not improving.      Meds ordered this encounter  Medications  . ipratropium (ATROVENT) 0.03 % nasal spray    Sig: Place 2 sprays into both nostrils every 12 (twelve) hours.    Dispense:  30 mL    Refill:  12    No follow-ups on file.    This visit occurred during the SARS-CoV-2 public health emergency.  Safety protocols were in place, including screening questions prior to the visit, additional usage of staff PPE, and extensive cleaning of exam room while observing appropriate contact time as indicated for disinfecting solutions.

## 2020-07-05 NOTE — Assessment & Plan Note (Signed)
We discussed options to help with management of this. No wheezing noted on exam.  Seems to have some upper airway congestion from PND.  Will start ipratropium nasal spray BID as needed.  Follow up as needed if not improving.

## 2020-07-05 NOTE — Patient Instructions (Signed)
Nonallergic Rhinitis Nonallergic rhinitis is a condition that causes symptoms that affect the nose, such as a runny nose and a stuffed-up nose (nasal congestion) that can make it hard to breathe through the nose. This condition is different from having an allergy (allergic rhinitis). Allergic rhinitis occurs when the body's defense system (immune system) reacts to a substance that you are allergic to (allergen), such as pollen, pet dander, mold, or dust. Nonallergic rhinitis has many similar symptoms, but it is not caused by allergens. Nonallergic rhinitis can be a short-term or long-term problem. What are the causes? This condition can be caused by many different things. Some common types of nonallergic rhinitis include: Infectious rhinitis  This is usually due to an infection in the upper respiratory tract. Vasomotor rhinitis  This is the most common type of long-term nonallergic rhinitis.  It is caused by too much blood flow through the nose, which makes the tissue inside of the nose swell.  Symptoms are often triggered by strong odors, cold air, stress, drinking alcohol, cigarette smoke, or changes in the weather. Occupational rhinitis  This type is caused by triggers in the workplace, such as chemicals, dusts, animal dander, or air pollution. Hormonal rhinitis  This type occurs in women as a result of an increase in the male hormone estrogen.  It may occur during pregnancy, puberty, and menstrual cycles.  Symptoms improve when estrogen levels drop. Drug-induced rhinitis Several drugs can cause nonallergic rhinitis, including:  Medicines that are used to treat high blood pressure, heart disease, and Parkinson disease.  Aspirin and NSAIDs.  Over-the-counter nasal decongestant sprays. These can cause a type of nonallergic rhinitis (rhinitis medicamentosa) when they are used for more than a few days. Nonallergic rhinitis with eosinophilia syndrome (NARES)  This type is caused by  having too much of a certain type of white blood cell (eosinophil). Nonallergic rhinitis can also be caused by a reaction to eating hot or spicy foods. This does not usually cause long-term symptoms. In some cases, the cause of nonallergic rhinitis is not known. What increases the risk? You are more likely to develop this condition if:  You are 30-60 years of age.  You are a woman. Women are twice as likely to have this condition. What are the signs or symptoms? Common symptoms of this condition include:  Nasal congestion.  Runny nose.  The feeling of mucus going down the back of the throat (postnasal drip).  Trouble sleeping at night and daytime sleepiness. Less common symptoms include:  Sneezing.  Coughing.  Itchy nose.  Bloodshot eyes. How is this diagnosed? This condition may be diagnosed based on:  Your symptoms and medical history.  A physical exam.  Allergy testing to rule out allergic rhinitis. You may have skin tests or blood tests. In some cases, the health care provider may take a swab of nasal secretions to look for an increased number of eosinophils. This would be done to confirm a diagnosis of NARES. How is this treated? Treatment for this condition depends on the cause. No single treatment works for everyone. Work with your health care provider to find the best treatment for you. Treatment may include:  Avoiding the things that trigger your symptoms.  Using medicines to relieve congestion, such as: ? Steroid nasal spray. There are many types. You may need to try a few to find out which one works best. ? Decongestant medicine. This may be an oral medicine or a nasal spray. These medicines are only used for   a short time.  Using medicines to relieve a runny nose. These may include antihistamine medicines or anticholinergic nasal sprays.  Surgery to remove tissue from inside the nose may be needed in severe cases if the condition has not improved after 6-12  months of medical treatment. Follow these instructions at home:  Take or use over-the-counter and prescription medicines only as told by your health care provider. Do not stop using your medicine even if you start to feel better.  Use salt-water (saline) rinses or other solutions (nasal washes or irrigations) to wash or rinse out the inside of your nose as told by your health care provider.  Do not take NSAIDs or medicines that contain aspirin if they make your symptoms worse.  Do not drink alcohol if it makes your symptoms worse.  Do not use any tobacco products, such as cigarettes, chewing tobacco, and e-cigarettes. If you need help quitting, ask your health care provider.  Avoid secondhand smoke.  Get some exercise every day. Exercise may help reduce symptoms of nonallergic rhinitis for some people. Ask your health care provider how much exercise and what types of exercise are safe for you.  Sleep with the head of your bed raised (elevated). This may reduce nighttime nasal congestion.  Keep all follow-up visits as told by your health care provider. This is important. Contact a health care provider if:  You have a fever.  Your symptoms are getting worse at home.  Your symptoms are not responding to medicine.  You develop new symptoms, especially a headache or nosebleed. This information is not intended to replace advice given to you by your health care provider. Make sure you discuss any questions you have with your health care provider. Document Revised: 11/08/2017 Document Reviewed: 02/16/2016 Elsevier Patient Education  2020 Elsevier Inc.  

## 2020-07-08 ENCOUNTER — Telehealth: Payer: Self-pay

## 2020-07-08 NOTE — Telephone Encounter (Signed)
[facility] nurse called stating that pt's daughter has declined home health services for nurse/physical therapy.

## 2020-07-14 ENCOUNTER — Telehealth: Payer: Self-pay

## 2020-07-14 NOTE — Telephone Encounter (Signed)
Jana Half with [facility] called reporting a discharge from wound care in the next week. I advised Iran Planas, PA-C didn't start wound care home health for this patient. She just wanted Jade to know how difficult patient's daughter has been through out the process. She has refused to let them come out 3 times a week. She only allowed them to come by once a week. She also wanted them to give her exact times and when they ran late she made them reschedule. When they ran late by daughter's definition.

## 2020-07-15 NOTE — Telephone Encounter (Signed)
Ok

## 2020-07-18 ENCOUNTER — Encounter: Payer: Self-pay | Admitting: Nurse Practitioner

## 2020-07-18 ENCOUNTER — Ambulatory Visit (INDEPENDENT_AMBULATORY_CARE_PROVIDER_SITE_OTHER): Payer: Medicare Other | Admitting: Nurse Practitioner

## 2020-07-18 VITALS — BP 148/80 | HR 66 | Temp 98.2°F | Wt 141.2 lb

## 2020-07-18 DIAGNOSIS — Z09 Encounter for follow-up examination after completed treatment for conditions other than malignant neoplasm: Secondary | ICD-10-CM

## 2020-07-18 NOTE — Progress Notes (Signed)
Acute Office Visit  Subjective:    Patient ID: Mark Mora, male    DOB: [birth year], 86 y.o.   MRN: [id number]  Chief Complaint  Patient presents with  . Follow-up  . Laceration    right hand laceration    HPI Patient is in today for hospital follow-up and suture removal from right hand after a fall 10 days ago. He reports that he lost his balance and reached out with his right hand to catch his fall. Subsequently his hand landed on an object resulting in a laceration to the webbing between his right thumb and index finger. He received 4 sutures to the laceration.   He also reports a hairline fracture to the wrist was noted on the xray, however, decision was made not to cast the hand due to the size and location of the fracture.   He reports no other injuries in the fall.   He denies hand pain, wrist pain, or difficulty moving his hand, wrist, or thumb.   Past Medical History:  Diagnosis Date  . Anemia in chronic kidney disease 06/10/2020  . Carotid atherosclerosis, right 11/05/2017  . CKD (chronic kidney disease) stage 3, GFR 30-59 ml/min 11/05/2017  . 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 Essential hypertension 11/05/2017  . Hearing loss due to cerumen impaction, bilateral 11/05/2017  . Hyperlipidemia   . Hypertension   . Pure hypercholesterolemia 11/05/2017  . Screening for diabetes mellitus 11/05/2017  . Stroke ([place])   . 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 HERNIA REPAIR      History reviewed. No pertinent family history.  Social History   Socioeconomic History  . Marital status: Widowed    Spouse name: Not on file  . Number of children: 2  . Years of education: 18  . Highest education level: 11th grade  Occupational History  . Occupation: retired    Comment: postman  Tobacco Use  . Smoking status: Former Smoker    Packs/day: 0.25    Years: 6.00    Pack years:  1.50    Types: Cigarettes  . Smokeless tobacco: Never Used  . Tobacco comment: quit age 23  Vaping Use  . Vaping Use: Never used  Substance and Sexual Activity  . Alcohol use: No  . Drug use: No  . Sexual activity: Never  Other Topics Concern  . Not on file  Social History Narrative   Reads daily on his IPAD. Coffee daily. Stays in house mostly due to the COVID virus this year.   Social Determinants of Health   Financial Resource Strain:   . Difficulty of Paying Living Expenses:   Food Insecurity:   . Worried About Charity fundraiser in the Last Year:   . Arboriculturist in the Last Year:   Transportation Needs:   . Film/video editor (Medical):   Marland Kitchen Lack of Transportation (Non-Medical):   Physical Activity:   . Days of Exercise per Week:   . Minutes of Exercise per Session:   Stress:   . Feeling of Stress :   Social Connections:   . Frequency of Communication with Friends and Family:   . Frequency of Social Gatherings with Friends and Family:   . Attends Religious Services:   . Active Member of Clubs or Organizations:   . Attends Archivist Meetings:   Marland Kitchen Marital Status:   Intimate Partner Violence:   .  Fear of Current or Ex-Partner:   . Emotionally Abused:   Marland Kitchen Physically Abused:   . Sexually Abused:     Outpatient Medications Prior to Visit  Medication Sig Dispense Refill  . acetaminophen (TYLENOL) 650 MG CR tablet Take by mouth.    . AMBULATORY NON FORMULARY MEDICATION Compression stockings for chronic venous stasis 20-79mmHg. 2 Device 0  . apixaban (ELIQUIS) 2.5 MG TABS tablet Take 1 tablet (2.5 mg total) by mouth 2 (two) times daily. 180 tablet 0  . atorvastatin (LIPITOR) 40 MG tablet TAKE 1 TABLET BY MOUTH  DAILY. NEEDS LAB WORK 90 tablet 3  . Cholecalciferol (VITAMIN D3) 2000 units TABS Take by mouth.    . clotrimazole (LOTRIMIN) 1 % cream Apply to affected area 2 times daily    . diclofenac Sodium (VOLTAREN) 1 % GEL Apply 4 g topically 4 (four)  times daily. To affected joint. 350 g 11  . famotidine (PEPCID) 20 MG tablet Take 1 tablet (20 mg total) by mouth 2 (two) times daily. 15 tablet 0  . furosemide (LASIX) 40 MG tablet Take 40 mg by mouth daily.    Marland Kitchen ipratropium (ATROVENT) 0.03 % nasal spray Place 2 sprays into both nostrils every 12 (twelve) hours. 30 mL 12  . lidocaine (LIDODERM) 5 % Place 1 patch onto the skin every 12 (twelve) hours as needed (pain). Remove & Discard patch within 12 hours or as directed by MD 30 patch 2  . loratadine-pseudoephedrine (CLARITIN-D 12-HOUR) 5-120 MG tablet Take 1 tablet by mouth in the morning and at bedtime.    . metoprolol succinate (TOPROL-XL) 50 MG 24 hr tablet Take by mouth.     . nystatin ointment (MYCOSTATIN) Apply 1 application topically 2 (two) times daily. 30 g 0  . tamsulosin (FLOMAX) 0.4 MG CAPS capsule TAKE 1 CAPSULE BY MOUTH  DAILY 90 capsule 3  . triamcinolone ointment (KENALOG) 0.1 % Apply 1 application topically 2 (two) times daily. To GLANS, FORESKIN, and SCROTUM. 60 g 6   No facility-administered medications prior to visit.    Allergies  Allergen Reactions  . Tramadol Other (See Comments)    hallucinations       Objective:    Physical Exam Vitals and nursing note reviewed.  Constitutional:      Appearance: Normal appearance.  HENT:     Head: Normocephalic.  Eyes:     Extraocular Movements: Extraocular movements intact.     Conjunctiva/sclera: Conjunctivae normal.     Pupils: Pupils are equal, round, and reactive to light.  Cardiovascular:     Rate and Rhythm: Normal rate and regular rhythm.     Pulses: Normal pulses.  Pulmonary:     Effort: Pulmonary effort is normal.  Abdominal:     General: Abdomen is flat.  Musculoskeletal:        General: Normal range of motion.     Cervical back: Normal range of motion.  Skin:    General: Skin is warm and dry.     Capillary Refill: Capillary refill takes less than 2 seconds.          Comments: Suture Removal: area  cleansed with iodine solution.  3 sutures removed completely with no evidence of bleeding, drainage, or re-opening of wound.  Evidence of 4th suture placement present from skin puncture wounds, however, suture no longer present. Dermabond placed over the entire wound to aid in approximation of the wound bed during healing given the area of frequent movement and thin skin present.  Wound covered with sterile bandage.  Patient tolerated procedure well.   Neurological:     General: No focal deficit present.     Mental Status: He is alert and oriented to person, place, and time.     BP (!) 148/80   Pulse 66   Temp 98.2 F (36.8 C) (Oral)   Wt 141 lb 3.7 oz (64.1 kg)   SpO2 98%   BMI 23.53 kg/m  Wt Readings from Last 3 Encounters:  07/18/20 141 lb 3.7 oz (64.1 kg)  07/05/20 142 lb (64.4 kg)  04/19/20 142 lb (64.4 kg)    Health Maintenance Due  Topic Date Due  . INFLUENZA VACCINE  07/10/2020    There are no preventive care reminders to display for this patient.   Lab Results  Component Value Date   TSH 1.86 04/07/2020   Lab Results  Component Value Date   WBC 8.4 04/19/2020   HGB 10.0 (L) 04/19/2020   HCT 30.5 (L) 04/19/2020   MCV 94.4 04/19/2020   PLT 291 04/19/2020   Lab Results  Component Value Date   NA 130 (L) 04/19/2020   K 4.5 04/19/2020   CO2 29 04/19/2020   GLUCOSE 91 04/19/2020   BUN 28 (H) 04/19/2020   CREATININE 1.26 (H) 04/19/2020   BILITOT 0.3 04/19/2020   ALKPHOS 52 04/19/2020   AST 19 04/19/2020   ALT 16 04/19/2020   PROT 6.3 (L) 04/19/2020   ALBUMIN 3.7 04/19/2020   CALCIUM 9.1 04/19/2020   ANIONGAP 6 04/19/2020   Lab Results  Component Value Date   CHOL 173 11/05/2017   Lab Results  Component Value Date   HDL 119 11/05/2017   Lab Results  Component Value Date   LDLCALC 41 11/05/2017   Lab Results  Component Value Date   TRIG 45 11/05/2017   Lab Results  Component Value Date   CHOLHDL 1.5 11/05/2017   No results found for:  HGBA1C     Assessment & Plan:   1. Hospital discharge follow-up Patient doing well with no additional falls since hospital visit.  Sutures removed- well healing intact incision with no evidence of infection. No range of motion or strength deficiency noted in hand or wrist on examination.  Hospital discharge paperwork reviewed. No evidence of lasting impact from suspected nondisplaced fracture of radial styloid. Recommend keeping the area clean and dry. Instructions on dermabond removal. No restrictions.  Follow-up if signs or symptoms of infection present or if pain in wrist develops.    Orma Render, NP

## 2020-07-18 NOTE — Patient Instructions (Signed)
Suture Removal, Care After This sheet gives you information about how to care for yourself after your procedure. Your health care provider may also give you more specific instructions. If you have problems or questions, contact your health care provider. What can I expect after the procedure? After your stitches (sutures) are removed, it is common to have:  Some discomfort and swelling in the area.  Slight redness in the area. Follow these instructions at home: If you have a bandage:  Wash your hands with soap and water before you change your bandage (dressing). If soap and water are not available, use hand sanitizer.  Change your dressing as told by your health care provider. If your dressing becomes wet or dirty, or develops a bad smell, change it as soon as possible.  If your dressing sticks to your skin, soak it in warm water to loosen it. Wound care   Check your wound every day for signs of infection. Check for: ? More redness, swelling, or pain. ? Fluid or blood. ? Warmth. ? Pus or a bad smell.  Wash your hands with soap and water before and after touching your wound.  Apply cream or ointment only as directed by your health care provider. If you are using cream or ointment, wash the area with soap and water 2 times a day to remove all the cream or ointment. Rinse off the soap and pat the area dry with a clean towel.  If you have skin glue or adhesive strips on your wound, leave these closures in place. They may need to stay in place for 2 weeks or longer. If adhesive strip edges start to loosen and curl up, you may trim the loose edges. Do not remove adhesive strips completely unless your health care provider tells you to do that.  Keep the wound area dry and clean. Do not take baths, swim, or use a hot tub until your health care provider approves.  Continue to protect the wound from injury.  Do not pick at your wound. Picking can cause an infection.  When your wound has  completely healed, wear sunscreen over it or cover it with clothing when you are outside. New scars get sunburned easily, which can make scarring worse. General instructions  Take over-the-counter and prescription medicines only as told by your health care provider.  Keep all follow-up visits as told by your health care provider. This is important. Contact a health care provider if:  You have redness, swelling, or pain around your wound.  You have fluid or blood coming from your wound.  Your wound feels warm to the touch.  You have pus or a bad smell coming from your wound.  Your wound opens up. Get help right away if:  You have a fever.  You have redness that is spreading from your wound. Summary  After your sutures are removed, it is common to have some discomfort and swelling in the area.  Wash your hands with soap and water before you change your bandage (dressing).  Keep the wound area dry and clean. Do not take baths, swim, or use a hot tub until your health care provider approves. This information is not intended to replace advice given to you by your health care provider. Make sure you discuss any questions you have with your health care provider. Document Revised: 11/08/2017 Document Reviewed: 01/01/2017 Elsevier Patient Education  2020 Elsevier Inc.  

## 2020-07-20 ENCOUNTER — Other Ambulatory Visit: Payer: Self-pay

## 2020-07-20 MED ORDER — HYDROCODONE-ACETAMINOPHEN 5-325 MG PO TABS: 1.0000 | ORAL_TABLET | Freq: Three times a day (TID) | ORAL | 0 refills | Status: DC | PRN

## 2020-07-20 NOTE — Telephone Encounter (Signed)
Routing to covering provider. Pt's daughter is requesting a med refill for hydrocodone-ace, #15. Per daughter, pt seen the surgeon today regarding their leg and informed pt may need his leg amputated. Currently in pain. Requesting rx to be sent to [facility] on [address]. Rx pended.

## 2020-07-20 NOTE — Telephone Encounter (Signed)
Norco 15 tabs sent to [facility] as requested. Please use sparingly and monitor for altered mental status.

## 2020-07-27 ENCOUNTER — Telehealth: Payer: Self-pay

## 2020-07-27 MED ORDER — METOPROLOL SUCCINATE ER 50 MG PO TB24: 50.0000 mg | ORAL_TABLET | Freq: Every day | ORAL | 1 refills | Status: AC

## 2020-07-27 MED ORDER — HYDROCODONE-ACETAMINOPHEN 5-325 MG PO TABS: 1.0000 | ORAL_TABLET | Freq: Three times a day (TID) | ORAL | 0 refills | Status: AC | PRN

## 2020-07-27 NOTE — Telephone Encounter (Signed)
Patient's daughter advised.  

## 2020-07-27 NOTE — Telephone Encounter (Signed)
Sent both

## 2020-07-27 NOTE — Telephone Encounter (Signed)
Mark Mora called and states he needs pain medication refill on Hydrocodone and Metoprolol for his heart rate.

## 2020-07-29 ENCOUNTER — Inpatient Hospital Stay: Payer: Medicare Other | Admitting: Family

## 2020-07-29 ENCOUNTER — Inpatient Hospital Stay: Payer: Medicare Other

## 2020-08-03 ENCOUNTER — Telehealth (INDEPENDENT_AMBULATORY_CARE_PROVIDER_SITE_OTHER): Payer: Medicare Other | Admitting: Physician Assistant

## 2020-08-03 VITALS — BP 172/80 | Temp 97.4°F | Ht 65.0 in | Wt 141.0 lb

## 2020-08-03 DIAGNOSIS — J310 Chronic rhinitis: Secondary | ICD-10-CM

## 2020-08-03 DIAGNOSIS — M48061 Spinal stenosis, lumbar region without neurogenic claudication: Secondary | ICD-10-CM | POA: Diagnosis not present

## 2020-08-03 DIAGNOSIS — N1832 Chronic kidney disease, stage 3b: Secondary | ICD-10-CM

## 2020-08-03 DIAGNOSIS — L97521 Non-pressure chronic ulcer of other part of left foot limited to breakdown of skin: Secondary | ICD-10-CM

## 2020-08-03 DIAGNOSIS — I10 Essential (primary) hypertension: Secondary | ICD-10-CM

## 2020-08-03 DIAGNOSIS — G894 Chronic pain syndrome: Secondary | ICD-10-CM

## 2020-08-03 MED ORDER — HYDROCODONE-ACETAMINOPHEN 5-325 MG PO TABS: ORAL_TABLET | ORAL | 0 refills | Status: DC

## 2020-08-03 NOTE — Progress Notes (Signed)
Patient ID: Mark Mora, male   DOB: [birth year], 86 y.o.   MRN: [id number] .Marland KitchenVirtual Visit via Telephone Note  I connected with Mark Mora on 08/03/2020 at  2:40 PM EDT by telephone and verified that I am speaking with the correct person using two identifiers.  Location: Patient: [facility] Provider: clinic   I discussed the limitations, risks, security and privacy concerns of performing an evaluation and management service by telephone and the availability of in person appointments. I also discussed with the patient that there may be a patient responsible charge related to this service. The patient expressed understanding and agreed to proceed.   History of Present Illness: Pt is a 86 yo male with PAD and current ulcers on left leg, chronic bilateral lower extremity edema, aortic valve stenosis, emphysema, GERD who calls into the clinic with his daughter for 3 month follow up.   Pt is seeing podiatry every 2 weeks for leg ulcer.  Pt is seeing nephrology(Dr. Ernesto Mora) and last note said to restrict salt to 2,000 -2500mg  a day, restrict fluid to 32 to 48 oz a day and continue lasix 40mg  daily. He continues to have his legs "fill back up with fluid". He could not tolerate the 40mg  bid.   Pt is SOB when he exerts himself.  Last echo was 06/17/2020  . Aortic Valve: The leaflets exhibit severely reduced excursion. The  leaflets are severely calcified. There is moderate stenosis, with peak and  mean gradients of 35.000 and 20.000 mmHg.  . Mitral Valve: There is severe posterior annular calcification.   He continues to be in a lot of pain with his legs. norco did help. Tramadol hallucinations. Wonders if he can have more norco.   Pt continues to have chronic rhinorrhea. On claritin D.   .. Active Ambulatory Problems    Diagnosis Date Noted  . CKD (chronic kidney disease) stage 3, GFR 30-59 ml/min 11/05/2017  . 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 Essential  hypertension 11/05/2017  . Carotid atherosclerosis, right 11/05/2017  . Pure hypercholesterolemia 11/05/2017  . Screening for diabetes mellitus 11/05/2017  . Hearing loss due to cerumen impaction, bilateral 11/05/2017  . Toenail fungus 03/20/2018  . Hearing loss of left ear due to cerumen impaction 03/20/2018  . Gastroesophageal reflux disease with esophagitis 03/23/2018  . Benign prostatic hyperplasia with urinary frequency 03/23/2018  . Neuropathy 03/23/2018  . History of prostate cancer 05/05/2018  . Numbness and tingling of both feet 05/05/2018  . Prostate cancer (Mark [facility]) 05/23/2018  . Weight loss 05/23/2018  . Spinal stenosis of lumbar region 05/23/2018  . Pulmonary embolus (Mark [facility]a) 07/15/2018  . Cerebrovascular accident (CVA) due to thrombosis of precerebral artery (Mark [facility]) 07/15/2018  . Paresthesias 07/16/2018  . Hypotension 07/16/2018  . Slow transit constipation 07/26/2018  . Bilateral upper abdominal discomfort 07/26/2018  . Burping 07/26/2018  . Opacity of lung on imaging study 07/26/2018  . Centrilobular emphysema (Mark [facility]) 08/15/2018  . Hyponatremia 08/15/2018  . Dystrophic nail 09/10/2018  . Bilateral lower extremity edema 09/10/2018  . Chronic venous stasis dermatitis of both lower extremities 09/10/2018  . Degenerative joint disease of cervical spine 09/26/2018  . Elevated troponin 07/04/2018  . GERD (gastroesophageal reflux disease) 09/26/2018  . Hyperlipemia 09/26/2018  . Hyperglycemia 07/04/2018  . Bilateral impacted cerumen 11/23/2018  . Chronic venous stasis 01/19/2019  . Chronic right shoulder pain 01/19/2019  . Ganglion cyst of volar aspect of left wrist 02/17/2019  . Fatigue 05/15/2019  . Sleeping excessive 05/15/2019  .  Paraphimosis 11/30/2019  . PAD (peripheral artery disease) (Mark Mora) 01/29/2020  . Vertigo 01/29/2020  . Pressure injury of contiguous region involving back, buttock, and hip, stage 1 02/15/2020  . Dysuria 03/01/2020  . Pressure injury of deep  tissue of sacral region 03/01/2020  . Excessive sleepiness 03/18/2020  . Ischemic ulcer of toe of left foot, limited to breakdown of skin (Mark [facility]) 04/04/2020  . Hyperkalemia 04/08/2020  . Hypocalcemia 04/08/2020  . Nausea 04/08/2020  . Epigastric pain 04/08/2020  . Pulmonary nodule 04/08/2020  . Renal atrophy, left 04/15/2020  . Nutritional anemia 05/06/2020  . Gait abnormality 05/06/2020  . Anemia in chronic kidney disease 06/10/2020  . 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 Nonrheumatic aortic valve stenosis 06/18/2020  . Non-pressure chronic ulcer of other part of left foot with fat layer exposed (Mark Mora) 07/04/2020  . Chronic rhinitis 07/05/2020   Resolved Ambulatory Problems    Diagnosis Date Noted  . Ingrown toenail of left foot 03/20/2018  . BPH (benign prostatic hyperplasia) 09/26/2018  . Drooling 03/18/2020   Past Medical History:  Diagnosis Date  . Hyperlipidemia   . Hypertension   . Stroke [facility])    Reviewed med, allergy, problem list.    Observations/Objective: No video only audio. No cough or labored breathing.   .. Today's Vitals   08/03/20 1314  BP: (!) 172/80  Temp: (!) 97.4 F (36.3 C)  TempSrc: Oral  Weight: 141 lb (64 kg)  Height: 5\' 5"  (1.651 m)   Body mass index is 23.46 kg/m.    Assessment and Plan: .Mark Mora was seen today for follow-up.  Diagnoses and all orders for this visit:  Chronic rhinitis  Spinal stenosis of lumbar region, unspecified whether neurogenic claudication present -     HYDROcodone-acetaminophen (NORCO/VICODIN) 5-325 MG tablet; Take one tablet daily as needed for chronic pain.  Ischemic ulcer of toe of left foot, limited to breakdown of skin (Mark [facility]a) -     HYDROcodone-acetaminophen (NORCO/VICODIN) 5-325 MG tablet; Take one tablet daily as needed for chronic pain.  Stage 3b chronic kidney disease  Essential hypertension  Chronic pain syndrome -     HYDROcodone-acetaminophen (NORCO/VICODIN) 5-325 MG tablet; Take one  tablet daily as needed for chronic pain.   Marland Kitchen.PDMP reviewed during this encounter. No concerns.  Discussed sparingly use of norco but tylenol is just not cutting it and tramadol causes hallucinations.  Use norco for breakthrough and tylenol for pain during the day. Will get on pain contract.  There is not a lot anyone can do about PVD and ulcers.   I am concerned about BP today being uncontrolled. CKD-stable at last lab check. Per daughter it has not been like this. Continue to check at [facility] and report readings. Nephrology took him off some medications for blood pressure. Continue with strict fluid and salt restrictions. If swelling is bad increase to one and 1/2 lasix tablet a day. Follow up 4 weeks with nephrology or myself with BP. Certainly pain could increase BP a bit. Will see how pain control helps.   Pt never picked up atrovent nasal spray for chronic rhinitis. Told to pick up and take bid daily. Stay on claritin D.    Follow Up Instructions:    I discussed the assessment and treatment plan with the patient. The patient was provided an opportunity to ask questions and all were answered. The patient agreed with the plan and demonstrated an understanding of the instructions.   The patient was advised to call back or seek an  in-person evaluation if the symptoms worsen or if the condition fails to improve as anticipated.  I provided 15 minutes of non-face-to-face time during this encounter discussing last few months and specialist appt and treatment plan moving forward as well as blood elevation.    Iran Planas, PA-C

## 2020-08-09 ENCOUNTER — Encounter: Payer: Self-pay | Admitting: Physician Assistant

## 2020-08-09 NOTE — Progress Notes (Signed)
Spoke with patient's daughter. They have not been checking blood pressure readings. She states she can do that. She will check blood pressure reading once daily for the next 3-4 days and report readings back. [place].

## 2020-08-12 ENCOUNTER — Telehealth: Payer: Self-pay

## 2020-08-12 NOTE — Telephone Encounter (Signed)
Patient's daughter called just to update Luvenia Starch of his recent blood pressures  Tuesday: 156/58 pulse 65  Wednesday: 163/68 pulse 61  Thursday: 153/61 pulse 62  Friday 118/57 pulse 59  FYI to PCP

## 2020-08-16 NOTE — Telephone Encounter (Signed)
Goal is to keep BP under 130/90. Fridays BP reading is perfect.

## 2020-08-16 NOTE — Telephone Encounter (Signed)
Patient's daughter advised.  

## 2020-08-29 ENCOUNTER — Encounter: Payer: Self-pay | Admitting: Physician Assistant

## 2020-08-29 ENCOUNTER — Ambulatory Visit (INDEPENDENT_AMBULATORY_CARE_PROVIDER_SITE_OTHER): Payer: Medicare Other | Admitting: Physician Assistant

## 2020-08-29 ENCOUNTER — Other Ambulatory Visit: Payer: Self-pay

## 2020-08-29 VITALS — BP 127/49 | HR 66 | Ht 65.0 in | Wt 140.0 lb

## 2020-08-29 DIAGNOSIS — M48061 Spinal stenosis, lumbar region without neurogenic claudication: Secondary | ICD-10-CM

## 2020-08-29 DIAGNOSIS — L97521 Non-pressure chronic ulcer of other part of left foot limited to breakdown of skin: Secondary | ICD-10-CM

## 2020-08-29 DIAGNOSIS — G894 Chronic pain syndrome: Secondary | ICD-10-CM

## 2020-08-29 DIAGNOSIS — R600 Localized edema: Secondary | ICD-10-CM

## 2020-08-29 DIAGNOSIS — N1832 Chronic kidney disease, stage 3b: Secondary | ICD-10-CM

## 2020-08-29 MED ORDER — HYDROCODONE-ACETAMINOPHEN 5-325 MG PO TABS: ORAL_TABLET | ORAL | 0 refills | Status: AC

## 2020-08-29 MED ORDER — DOXYCYCLINE HYCLATE 100 MG PO TABS
100.0000 mg | ORAL_TABLET | Freq: Two times a day (BID) | ORAL | 0 refills | Status: DC
Start: 2020-08-29 — End: 2020-09-14

## 2020-08-29 NOTE — Progress Notes (Signed)
Subjective:    Patient ID: Mark [facility]a, male    DOB: [birth year], 86 y.o.   MRN: [id number]  HPI  Pt is a 86 yo male who presents to the clinic with his daughter to discuss worsening swelling of both feet and pain. Pt has PAD and left great toe ulcer with very little blood circulating to lower extremities. Pt has recurrent swelling of lower extremities and need for frequent diuresis in hospital. He sees nephrology and podiatry regularly to manage these conditions and keep balance. He is taking 60mg  of lasix daily right now and for the last 2 weeks. He did not take today because he was coming to the doctor and did not want to urinate all over himself. Next visit with podiatry is next Friday.   Pt continues to be in pain. He wonders if he can have more norco. He is also taking tylenol which helps as well.    .. Active Ambulatory Problems    Diagnosis Date Noted  . CKD (chronic kidney disease) stage 3, GFR 30-59 ml/min 11/05/2017  . 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 Essential hypertension 11/05/2017  . Carotid atherosclerosis, right 11/05/2017  . Pure hypercholesterolemia 11/05/2017  . Screening for diabetes mellitus 11/05/2017  . Hearing loss due to cerumen impaction, bilateral 11/05/2017  . Toenail fungus 03/20/2018  . Hearing loss of left ear due to cerumen impaction 03/20/2018  . Gastroesophageal reflux disease with esophagitis 03/23/2018  . Benign prostatic hyperplasia with urinary frequency 03/23/2018  . Neuropathy 03/23/2018  . History of prostate cancer 05/05/2018  . Numbness and tingling of both feet 05/05/2018  . Prostate cancer ([place]) 05/23/2018  . Weight loss 05/23/2018  . Spinal stenosis of lumbar region 05/23/2018  . Pulmonary embolus ([place]) 07/15/2018  . Cerebrovascular accident (CVA) due to thrombosis of precerebral artery ([place]) 07/15/2018  . Paresthesias 07/16/2018  . Hypotension 07/16/2018  . Slow transit constipation 07/26/2018  .  Bilateral upper abdominal discomfort 07/26/2018  . Burping 07/26/2018  . Opacity of lung on imaging study 07/26/2018  . Centrilobular emphysema ([place]) 08/15/2018  . Hyponatremia 08/15/2018  . Dystrophic nail 09/10/2018  . Bilateral leg edema 09/10/2018  . Chronic venous stasis dermatitis of both lower extremities 09/10/2018  . Degenerative joint disease of cervical spine 09/26/2018  . Elevated troponin 07/04/2018  . GERD (gastroesophageal reflux disease) 09/26/2018  . Hyperlipemia 09/26/2018  . Hyperglycemia 07/04/2018  . Bilateral impacted cerumen 11/23/2018  . Chronic venous stasis 01/19/2019  . Chronic right shoulder pain 01/19/2019  . Ganglion cyst of volar aspect of left wrist 02/17/2019  . Fatigue 05/15/2019  . Sleeping excessive 05/15/2019  . Paraphimosis 11/30/2019  . PAD (peripheral artery disease) ([place]) 01/29/2020  . Vertigo 01/29/2020  . Pressure injury of contiguous region involving back, buttock, and hip, stage 1 02/15/2020  . Dysuria 03/01/2020  . Pressure injury of deep tissue of sacral region 03/01/2020  . Excessive sleepiness 03/18/2020  . Ischemic ulcer of toe of left foot, limited to breakdown of skin ([place]) 04/04/2020  . Hyperkalemia 04/08/2020  . Hypocalcemia 04/08/2020  . Nausea 04/08/2020  . Epigastric pain 04/08/2020  . Pulmonary nodule 04/08/2020  . Renal atrophy, left 04/15/2020  . Nutritional anemia 05/06/2020  . Gait abnormality 05/06/2020  . Anemia in chronic kidney disease 06/10/2020  . 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 Nonrheumatic aortic valve stenosis 06/18/2020  . Non-pressure chronic ulcer of other part of left foot with fat layer exposed ([place]) 07/04/2020  . Chronic  rhinitis 07/05/2020  . Chronic pain syndrome 08/30/2020   Resolved Ambulatory Problems    Diagnosis Date Noted  . Ingrown toenail of left foot 03/20/2018  . BPH (benign prostatic hyperplasia) 09/26/2018  . Drooling 03/18/2020   Past Medical History:  Diagnosis  Date  . Hyperlipidemia   . Hypertension   . Stroke [facility])     Review of Systems See HPI.     Objective:   Physical Exam Vitals reviewed.  Constitutional:      Appearance: Normal appearance.     Comments: In wheelchair  Cardiovascular:     Rate and Rhythm: Normal rate.     Pulses: Normal pulses.     Heart sounds: Murmur heard.   Pulmonary:     Effort: Pulmonary effort is normal.     Breath sounds: Normal breath sounds.  Musculoskeletal:     Right lower leg: Edema present.     Left lower leg: Edema present.     Comments: 2+ pitting edema bilateral legs with open and closed blisters across dorsal bilateral feet.  No pedal pulses Erythematous/purple bilateral feet.   Neurological:     General: No focal deficit present.     Mental Status: He is alert.  Psychiatric:        Mood and Affect: Mood normal.           Assessment & Plan:  .Marland KitchenOiva was seen today for wound check.  Diagnoses and all orders for this visit:  Bilateral leg edema -     BASIC METABOLIC PANEL WITH GFR  Ischemic ulcer of toe of left foot, limited to breakdown of skin ([facility]) -     HYDROcodone-acetaminophen (NORCO/VICODIN) 5-325 MG tablet; Take one tablet daily as needed for chronic pain.  Stage 3b chronic kidney disease -     BASIC METABOLIC PANEL WITH GFR  Spinal stenosis of lumbar region, unspecified whether neurogenic claudication present -     HYDROcodone-acetaminophen (NORCO/VICODIN) 5-325 MG tablet; Take one tablet daily as needed for chronic pain.  Chronic pain syndrome -     HYDROcodone-acetaminophen (NORCO/VICODIN) 5-325 MG tablet; Take one tablet daily as needed for chronic pain.  Other orders -     doxycycline (VIBRA-TABS) 100 MG tablet; Take 1 tablet (100 mg total) by mouth 2 (two) times daily.   We are in a tough position with patient. He is accumulating edema in legs and we can not compress due to his PVD status. Weight is fairly stable and that is reassuring. Discussed plan with  Dr. Madilyn Fireman in office. She agrees. Increase lasix to 80mg  daily for the next 2-5 days until edema is better. Hopefully and we push back down to 60mg  in a few days. Recheck BMP on Friday to watch kidney function.  GFR 50 last recheck 9/16.  A few blisters have ruptured and appear infected. Start doxycycline for 10 days.  Please keep feet elevated with light compression with ace. Do not pop blisters.  Keep follow up with podiatry.  If edema is getting worse go to ED for more management.   Increased norco to one in am and one in pm use tylenol in between but not with norco.  .Marland KitchenPDMP reviewed during this encounter. No concerns. Discussed sedation warning and increased fall risk.   Spent 40 minutes with patient in reviewing chart, discussing, plan, educating on next steps.

## 2020-08-29 NOTE — Patient Instructions (Signed)
Increase to 80mg  of lasix daily.  Keep feet elevated Keep feet wrapped.  Start doxycycline.

## 2020-08-30 ENCOUNTER — Encounter: Payer: Self-pay | Admitting: Physician Assistant

## 2020-08-30 DIAGNOSIS — G894 Chronic pain syndrome: Secondary | ICD-10-CM | POA: Insufficient documentation

## 2020-09-02 ENCOUNTER — Telehealth: Payer: Self-pay

## 2020-09-02 LAB — BASIC METABOLIC PANEL WITH GFR
BUN/Creatinine Ratio: 25 (calc) — ABNORMAL HIGH (ref 6–22)
BUN: 43 mg/dL — ABNORMAL HIGH (ref 7–25)
CO2: 28 mmol/L (ref 20–32)
Calcium: 9 mg/dL (ref 8.6–10.3)
Chloride: 104 mmol/L (ref 98–110)
Creat: 1.75 mg/dL — ABNORMAL HIGH (ref 0.70–1.11)
GFR, Est African American: 37 mL/min/{1.73_m2} — ABNORMAL LOW (ref >=60)
GFR, Est Non African American: 32 mL/min/{1.73_m2} — ABNORMAL LOW (ref >=60)
Glucose, Bld: 67 mg/dL (ref 65–139)
Potassium: 4.1 mmol/L (ref 3.5–5.3)
Sodium: 140 mmol/L (ref 135–146)

## 2020-09-02 NOTE — Telephone Encounter (Signed)
Do not need to increase fluid intake.  Just continue with a 2-1/2 bottles daily but just adjust the Lasix as per prior note.

## 2020-09-02 NOTE — Telephone Encounter (Signed)
Daughter called to report that patient is only drinking about 2 and 1/2 bottles of water per day.  Daughter wants to know if he should increase his water intake?  Please advise.  Advised daughter again to take him to the ER.    Per Luvenia Starch:   Ok lasix is making him a little dry. You need to back off lasix again down to 60mg  again. How is swelling if not improving he will need to be monitoring in ED as they get the fluid off legs.  Patient's daughter made aware of recommendations. She states swelling is no better. She was advised to take patient to the ER.

## 2020-09-02 NOTE — Progress Notes (Signed)
Ok lasix is making him a little dry. You need to back off lasix again down to 60mg  again. How is swelling if not improving he will need to be monitoring in ED as they get the fluid off legs.

## 2020-09-05 NOTE — Telephone Encounter (Signed)
Mark [facility]a did take him to the hospital on Friday.

## 2020-09-07 ENCOUNTER — Ambulatory Visit: Payer: Medicare Other

## 2020-09-09 ENCOUNTER — Telehealth: Payer: Self-pay

## 2020-09-09 NOTE — Telephone Encounter (Signed)
We need to consult nephrology and reach out to them but not being on lasix is going to make the fluid come back. He is going to have to be on at least 40mg  to 60mg  daily. For the weekend do 40mg  daily. I will see if we can reach out to nephrology and get their suggestion.

## 2020-09-09 NOTE — Telephone Encounter (Signed)
Patient and daughter advised. He has enough to last through weekend and until they hear back from [place].   No further needs at this time. Thank you!

## 2020-09-09 NOTE — Telephone Encounter (Signed)
Patient's daughter called stating that at last appointment with Luvenia Starch he was instructed to back down to 60 mg of Lasix. Daughter is not sure what he was getting while in hospital, but discharge summary shows for him to take 20 mg PRN.  Daughter reports swelling gradually coming back in legs and if present in his ankles. Denies it being painful. Has not taken any lasix since he left hospital on Monday 29th.   Please advise on how patient needs to be taking lasix moving forward (dose and frequency).

## 2020-09-12 NOTE — Telephone Encounter (Signed)
Can we call daughter and see if they have called neurology to see what dose they want patient at?

## 2020-09-13 ENCOUNTER — Telehealth: Payer: Self-pay

## 2020-09-13 NOTE — Telephone Encounter (Signed)
They have not contacted yet. I encouraged patient's daughter to reach out to them. He is having a lot of swelling again. She will call today. She states patient having a lot of pain in feet. I did let her know this is probably exacerbated by the swelling and first step needs to be to talk with nephrology. She will call them. He does have a hospital follow up appt tomorrow.

## 2020-09-13 NOTE — Telephone Encounter (Signed)
Spoke with Caren Griffins, she reports that patient is in significant pain.  He normally take his hydrocodone between 6-8am.  He then takes 2 650mg  Tylenol around 11-12.  Daughter wants to know if patient can take more hydrocodone.  Patient has a hospital follow up appointment scheduled with you tomorrow afternoon.

## 2020-09-13 NOTE — Telephone Encounter (Signed)
As long as he does not take the tylenol he can take norco every 6 hours.

## 2020-09-14 ENCOUNTER — Ambulatory Visit (INDEPENDENT_AMBULATORY_CARE_PROVIDER_SITE_OTHER): Payer: Medicare Other | Admitting: Physician Assistant

## 2020-09-14 VITALS — BP 126/66 | HR 59 | Temp 98.4°F

## 2020-09-14 DIAGNOSIS — H6123 Impacted cerumen, bilateral: Secondary | ICD-10-CM

## 2020-09-14 DIAGNOSIS — N1832 Chronic kidney disease, stage 3b: Secondary | ICD-10-CM

## 2020-09-14 DIAGNOSIS — H938X2 Other specified disorders of left ear: Secondary | ICD-10-CM | POA: Insufficient documentation

## 2020-09-14 DIAGNOSIS — I739 Peripheral vascular disease, unspecified: Secondary | ICD-10-CM | POA: Diagnosis not present

## 2020-09-14 DIAGNOSIS — I70235 Atherosclerosis of native arteries of right leg with ulceration of other part of foot: Secondary | ICD-10-CM | POA: Diagnosis not present

## 2020-09-14 DIAGNOSIS — I872 Venous insufficiency (chronic) (peripheral): Secondary | ICD-10-CM | POA: Diagnosis not present

## 2020-09-14 DIAGNOSIS — Z23 Encounter for immunization: Secondary | ICD-10-CM | POA: Diagnosis not present

## 2020-09-14 DIAGNOSIS — B3749 Other urogenital candidiasis: Secondary | ICD-10-CM

## 2020-09-14 MED ORDER — NYSTATIN 100000 UNIT/GM EX POWD: 1.0000 "application " | Freq: Three times a day (TID) | CUTANEOUS | 0 refills | Status: DC

## 2020-09-14 MED ORDER — NYSTATIN 100000 UNIT/GM EX OINT: 1.0000 "application " | TOPICAL_OINTMENT | Freq: Two times a day (BID) | CUTANEOUS | 0 refills | Status: AC

## 2020-09-14 MED ORDER — FLUCONAZOLE 150 MG PO TABS: 150.0000 mg | ORAL_TABLET | Freq: Once | ORAL | 0 refills | Status: AC

## 2020-09-14 NOTE — Patient Instructions (Addendum)
Increase lasix to 60mg  daily on days with bad swelling. If more controlled can back down to 40mg  on those days.  Use nystatin ointment and powder as needed. One diflucan to take once.  Hydrocodone ok for 3 times a day.  Keep wounds dressed with xeroform on feet.    Genital Yeast Infection, Male In men, a genital yeast infection is a condition that causes soreness, swelling, and redness (inflammation) of the head of the penis (glans penis). A genital yeast infection can be spread through sexual contact, but it can also develop without sexual contact. If the infection is not treated properly, it is likely to come back. What are the causes? This condition is caused by a change in the normal balance of the yeast and bacteria that live on the skin. This change causes an overgrowth of yeast, which causes the inflammation. Many types of yeast can cause this infection, but Candida is the most common. What increases the risk? The following factors may make you more likely to develop this condition:  Taking antibiotics.  Having diabetes.  Being exposed to the infection by a sexual partner.  Being uncircumcised.  Having a weak body defense system (immune system).  Taking steroid medicines for a long time.  Having poor hygiene. What are the signs or symptoms? Symptoms of this condition include:  Itching of the groin and penis.  Dry, red, or cracked skin on the penis.  Swelling of the genital area.  Pain while urinating or difficulty urinating.  Thick, bad-smelling discharge on the penis. How is this diagnosed? This condition may be diagnosed based on:  Your medical history.  A physical exam. You may also have tests, such as:  Test of a sample of discharge from the penis.  Urine tests.  Blood tests. How is this treated? This condition is treated with:  Anti-fungal creams or medicines. Anti-fungal medicines may be prescribed by your health care provider or they may be  available over-the-counter.  Self-care at home. For men who are not circumcised, circumcision may be recommended to control infections that return and are difficult to treat. Follow these instructions at home: Medicines   Take or apply over-the-counter and prescription medicines only as told by your health care provider.  Take your anti-fungal medicine as told by your health care provider. Do not stop taking the medicine even if you start to feel better. Self care  Wash your penis with soap and water every day. If you are not circumcised, pull back the foreskin to wash. Make sure to dry your penis completely after washing.  Wear breathable, cotton underwear.  Keep your underwear clean and dry. General instructions  Do not have sex until your health care provider has approved. Tell your sexual partner that you have a yeast infection. That person should go for treatment even if no symptoms are present.  If you have diabetes, keep your blood sugar levels within your target range.  Keep all follow-up visits as told by your health care provider. This is important. Contact a health care provider if you:  Have a fever.  Have symptoms that go away and then return.  Do not get better with treatment.  Have symptoms that get worse.  Have new symptoms. Get help right away if:  Your swelling and inflammation become so severe that you cannot urinate. Summary  In men, a genital yeast infection is a condition that causes soreness, swelling, and redness (inflammation) of the head of the penis (glans penis).  This condition is caused by a change in the normal balance of the yeast and bacteria that live on the skin. This change causes an overgrowth of yeast, which causes the inflammation.  A genital yeast infection usually spreads through sexual contact, but it can develop without sexual contact. For instance, you may be more likely to develop this infection if you take antibiotics or  steroids, have diabetes, are not circumcised, have a weak immune system, or have poor hygiene.  This condition is treated with anti-fungal cream or pills along with self-care at home. This information is not intended to replace advice given to you by your health care provider. Make sure you discuss any questions you have with your health care provider. Document Revised: 12/31/2017 Document Reviewed: 12/31/2017 Elsevier Patient Education  2020 [employer].

## 2020-09-14 NOTE — Progress Notes (Signed)
Subjective:    Patient ID: Mark Mora, male    DOB: 1920-09-08, 86 y.o.   MRN: [id number]  HPI  Pt is a 86 yo male who presents to the clinic to follow up after hospital with ongoing chronic problem of PVD, left toe ulcer, CKD, bilateral leg swelling.   He was just in hospital from 09/02/2020 to 09/07/2020 to get his swelling in legs back down. He was treated with antibiotics for cellulitis and lasix for edema.  He was great in hospital but lasix was decreased to 20mg  and swelling has returned. His legs are swelling. He continues to have open blisters. They are very painful. norco twice a day is not enough. Seeing podiatry every 2 weeks. Using xeroform.   He has a itchy rash in groin that is red and irritated. Was using portable urinal in hospital and then it got irritated.   Pt continues to feel like ears are blocked and have pressure. atrovent helping runny nose.    .. Active Ambulatory Problems    Diagnosis Date Noted  . CKD (chronic kidney disease) stage 3, GFR 30-59 ml/min (HCC) 11/05/2017  . 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 Essential hypertension 11/05/2017  . Carotid atherosclerosis, right 11/05/2017  . Pure hypercholesterolemia 11/05/2017  . Screening for diabetes mellitus 11/05/2017  . Hearing loss due to cerumen impaction, bilateral 11/05/2017  . Toenail fungus 03/20/2018  . Hearing loss of left ear due to cerumen impaction 03/20/2018  . Gastroesophageal reflux disease with esophagitis 03/23/2018  . Benign prostatic hyperplasia with urinary frequency 03/23/2018  . Neuropathy 03/23/2018  . History of prostate cancer 05/05/2018  . Numbness and tingling of both feet 05/05/2018  . Prostate cancer ([place]) 05/23/2018  . Weight loss 05/23/2018  . Spinal stenosis of lumbar region 05/23/2018  . Pulmonary embolus ([place]) 07/15/2018  . Cerebrovascular accident (CVA) due to thrombosis of precerebral artery ([place]) 07/15/2018  . Paresthesias 07/16/2018   . Hypotension 07/16/2018  . Slow transit constipation 07/26/2018  . Bilateral upper abdominal discomfort 07/26/2018  . Burping 07/26/2018  . Opacity of lung on imaging study 07/26/2018  . Centrilobular emphysema ([place]) 08/15/2018  . Hyponatremia 08/15/2018  . Dystrophic nail 09/10/2018  . Bilateral leg edema 09/10/2018  . Chronic venous stasis dermatitis of both lower extremities 09/10/2018  . Degenerative joint disease of cervical spine 09/26/2018  . Elevated troponin 07/04/2018  . GERD (gastroesophageal reflux disease) 09/26/2018  . Hyperlipemia 09/26/2018  . Hyperglycemia 07/04/2018  . Bilateral impacted cerumen 11/23/2018  . Chronic venous stasis 01/19/2019  . Chronic right shoulder pain 01/19/2019  . Ganglion cyst of volar aspect of left wrist 02/17/2019  . Fatigue 05/15/2019  . Sleeping excessive 05/15/2019  . Paraphimosis 11/30/2019  . PAD (peripheral artery disease) ([place]) 01/29/2020  . Vertigo 01/29/2020  . Pressure injury of contiguous region involving back, buttock, and hip, stage 1 02/15/2020  . Dysuria 03/01/2020  . Pressure injury of deep tissue of sacral region 03/01/2020  . Excessive sleepiness 03/18/2020  . Ischemic ulcer of toe of left foot, limited to breakdown of skin ([place]) 04/04/2020  . Hyperkalemia 04/08/2020  . Hypocalcemia 04/08/2020  . Nausea 04/08/2020  . Epigastric pain 04/08/2020  . Pulmonary nodule 04/08/2020  . Renal atrophy, left 04/15/2020  . Nutritional anemia 05/06/2020  . Gait abnormality 05/06/2020  . Anemia in chronic kidney disease 06/10/2020  . 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 Nonrheumatic aortic valve stenosis 06/18/2020  . Non-pressure chronic ulcer of other part  of left foot with fat layer exposed ([place]) 07/04/2020  . Chronic rhinitis 07/05/2020  . Chronic pain syndrome 08/30/2020  . Peripheral venous insufficiency 09/14/2020  . Atherosclerosis of native arteries of right leg with ulceration of other part of foot ([place])  09/14/2020  . PVD (peripheral vascular disease) ([place]) 09/14/2020  . Candida infection of genital region 09/14/2020  . Ear pressure, left 09/14/2020   Resolved Ambulatory Problems    Diagnosis Date Noted  . Ingrown toenail of left foot 03/20/2018  . BPH (benign prostatic hyperplasia) 09/26/2018  . Drooling 03/18/2020   Past Medical History:  Diagnosis Date  . Hyperlipidemia   . Hypertension   . Stroke [facility])       Review of Systems See HPI>     Objective:   Physical Exam Vitals reviewed.  Constitutional:      Appearance: Normal appearance.  HENT:     Head: Normocephalic.  Cardiovascular:     Rate and Rhythm: Normal rate and regular rhythm.     Pulses: Normal pulses.     Heart sounds: Murmur heard.   Pulmonary:     Effort: Pulmonary effort is normal.     Breath sounds: Normal breath sounds.  Musculoskeletal:     Right lower leg: Edema present.     Left lower leg: Edema present.     Comments: 2 +pitting edema in bilateral feet.  Left great toe ulcer Multiple open blisters of feet on right dorsal foot. Erythematous and very tender to touch.   Neurological:     General: No focal deficit present.     Mental Status: He is alert and oriented to person, place, and time.  Psychiatric:        Mood and Affect: Mood normal.           Assessment & Plan:  .Marland KitchenDenton was seen today for hospitalization follow-up.  Diagnoses and all orders for this visit:  Peripheral venous insufficiency  Stage 3b chronic kidney disease ([place])  PVD (peripheral vascular disease) ([place])  Atherosclerosis of native arteries of right leg with ulceration of other part of foot (HCC)  Candida infection of genital region -     nystatin (MYCOSTATIN/NYSTOP) powder; Apply 1 application topically 3 (three) times daily. -     nystatin ointment (MYCOSTATIN); Apply 1 application topically 2 (two) times daily. -     fluconazole (DIFLUCAN) 150 MG tablet; Take 1 tablet (150 mg total) by mouth once for 1  dose.  Ear pressure, left  Flu vaccine need -     Flu Vaccine QUAD High Dose(Fluad)  Bilateral impacted cerumen   Creams given for yeast infection of groin. Discussed prevention. One diflucan to speed up process.   Marland Kitchen.Cerumen Removal Template: Indication: Cerumen impaction of the ear(s) Medical necessity statement: On physical examination, cerumen impairs clinically significant portions of the external auditory canal, and tympanic membrane. Noted obstructive, copious cerumen that cannot be removed without magnification and instrumentations requiring physician skills Consent: Discussed benefits and risks of procedure and verbal consent obtained Procedure: Patient was prepped for the procedure. Utilized an otoscope to assess and take note of the ear canal, the tympanic membrane, and the presence, amount, and placement of the cerumen. Gentle water irrigation and soft plastic curette was utilized to remove cerumen.  Post procedure examination shows cerumen was completely removed. Patient tolerated procedure well. The patient is made aware that they may experience temporary vertigo, temporary hearing loss, and temporary discomfort. If these symptom last for more than 24 hours  to call the clinic or proceed to the ED.  Continue atrovent nasal spray.   Last GFR was 42 on 9/29.  Continue xeroform dressings and feet elevation. Increased lasix to 60mg . Stay between 40mg  and 60mg  daily as needed. Discussed kidney ok but we are having to not dry out kidney and try to get fluid off.  Pt does need to consider amputation to help with the persistent swelling/blood supply and pain.   Ok norco up to three times a day for pain. Do not take with tylenol.   Follow up in 4 weeks.

## 2020-09-14 NOTE — Telephone Encounter (Signed)
Left VM on Daughter's number with information and that it can also be discussed at patient's appointment this afternoon.

## 2020-09-15 ENCOUNTER — Telehealth: Payer: Self-pay

## 2020-09-15 ENCOUNTER — Telehealth: Payer: Self-pay | Admitting: Neurology

## 2020-09-15 NOTE — Telephone Encounter (Signed)
Debbie with [facility] called [phone]) after they received Hospice referral for patient. She states family would like Latasia Silberstein to follow him as PCP for Hospice care. Please advise if okay.

## 2020-09-15 NOTE — Telephone Encounter (Signed)
Caren Griffins called back. She had him take medication about 30 minutes prior and when she rechecked BP while on phone with me it was 154/63.  I let her know this was much better and for her to continue to just monitor and make sure patient is taking medication as directed and around the same time every day.

## 2020-09-15 NOTE — Telephone Encounter (Signed)
Cynthia left msg asking if patient was supposed to stop his BP medication. Patient did not take it and his blood pressure is currently 193/116. I spoke with Luvenia Starch and called Caren Griffins back, having to leave a msg. In message advised Caren Griffins that patient is to take this daily and at no point was he instructed to stop. I advised her to have patient take his medication and recheck BP in about 30 minutes to an hour. Stated that if patient is starting to have chest pain, difficulty breathing, or becomes disoriented to take him for urgent evaluation.   Call back information provided in msg for [place]. FYI to PCP

## 2020-09-16 ENCOUNTER — Encounter: Payer: Self-pay | Admitting: Physician Assistant

## 2020-09-16 NOTE — Telephone Encounter (Signed)
Mark Mora made aware

## 2020-09-16 NOTE — Telephone Encounter (Signed)
Ok

## 2020-09-27 ENCOUNTER — Other Ambulatory Visit: Payer: Self-pay | Admitting: Physician Assistant

## 2020-09-27 DIAGNOSIS — B3749 Other urogenital candidiasis: Secondary | ICD-10-CM

## 2020-10-07 ENCOUNTER — Telehealth: Payer: Self-pay

## 2020-10-07 NOTE — Telephone Encounter (Signed)
Mark Mora advising of recommendations

## 2020-10-07 NOTE — Telephone Encounter (Signed)
Mark Mora wants to make sure there is no reason her dad should not get the booster?   He got J&J back in March. OK for booster?

## 2020-10-07 NOTE — Telephone Encounter (Signed)
Should most definitely get the vaccine.

## 2020-10-09 ENCOUNTER — Other Ambulatory Visit: Payer: Self-pay | Admitting: Physician Assistant

## 2020-10-09 DIAGNOSIS — N401 Enlarged prostate with lower urinary tract symptoms: Secondary | ICD-10-CM

## 2020-10-11 ENCOUNTER — Ambulatory Visit: Payer: Medicare Other | Admitting: Physician Assistant

## 2020-11-21 ENCOUNTER — Telehealth: Payer: Self-pay

## 2020-11-21 NOTE — Telephone Encounter (Signed)
Mark Mora called and states her dad away this weekend.

## 2020-11-28 NOTE — Telephone Encounter (Signed)
Please change chart to deceased.

## 2020-12-10 DEATH — deceased
